# Patient Record
Sex: Female | Born: 1945 | Race: White | Hispanic: No | Marital: Single | Smoking: Current some day smoker
Health system: Southern US, Community
[De-identification: ages and names within clinical notes are randomized; demographics above are authoritative.]

## PROBLEM LIST (undated history)

## (undated) ENCOUNTER — Emergency Department (HOSPITAL_COMMUNITY): Admission: EM | Payer: Medicare Other | Source: Home / Self Care

## (undated) DIAGNOSIS — I2510 Atherosclerotic heart disease of native coronary artery without angina pectoris: Secondary | ICD-10-CM

## (undated) DIAGNOSIS — J449 Chronic obstructive pulmonary disease, unspecified: Secondary | ICD-10-CM

## (undated) DIAGNOSIS — I219 Acute myocardial infarction, unspecified: Secondary | ICD-10-CM

## (undated) DIAGNOSIS — I10 Essential (primary) hypertension: Secondary | ICD-10-CM

## (undated) HISTORY — PX: ABDOMINAL HYSTERECTOMY: SHX81

## (undated) HISTORY — PX: CORONARY ANGIOPLASTY WITH STENT PLACEMENT: SHX49

## (undated) HISTORY — PX: TONSILLECTOMY: SUR1361

---

## 2008-12-27 ENCOUNTER — Inpatient Hospital Stay: Payer: Self-pay | Admitting: Internal Medicine

## 2010-04-07 ENCOUNTER — Ambulatory Visit: Payer: Self-pay | Admitting: Family Medicine

## 2014-06-08 DIAGNOSIS — H269 Unspecified cataract: Secondary | ICD-10-CM | POA: Insufficient documentation

## 2015-11-27 ENCOUNTER — Other Ambulatory Visit: Payer: Self-pay | Admitting: Internal Medicine

## 2015-11-27 DIAGNOSIS — Z1231 Encounter for screening mammogram for malignant neoplasm of breast: Secondary | ICD-10-CM

## 2015-11-28 ENCOUNTER — Other Ambulatory Visit: Payer: Self-pay | Admitting: Internal Medicine

## 2015-11-28 ENCOUNTER — Ambulatory Visit
Admission: RE | Admit: 2015-11-28 | Discharge: 2015-11-28 | Disposition: A | Discharge status: Home / Self Care | Payer: Medicare Other | Source: Ambulatory Visit | Attending: Internal Medicine | Admitting: Internal Medicine

## 2015-11-28 DIAGNOSIS — Z1231 Encounter for screening mammogram for malignant neoplasm of breast: Secondary | ICD-10-CM

## 2015-12-02 ENCOUNTER — Other Ambulatory Visit: Payer: Self-pay | Admitting: Internal Medicine

## 2015-12-02 DIAGNOSIS — R928 Other abnormal and inconclusive findings on diagnostic imaging of breast: Secondary | ICD-10-CM

## 2015-12-09 ENCOUNTER — Ambulatory Visit
Admission: RE | Admit: 2015-12-09 | Discharge: 2015-12-09 | Disposition: A | Discharge status: Home / Self Care | Payer: Medicare Other | Source: Ambulatory Visit | Attending: Internal Medicine | Admitting: Internal Medicine

## 2015-12-09 ENCOUNTER — Other Ambulatory Visit: Payer: Self-pay | Admitting: Internal Medicine

## 2015-12-09 DIAGNOSIS — R928 Other abnormal and inconclusive findings on diagnostic imaging of breast: Secondary | ICD-10-CM

## 2015-12-09 DIAGNOSIS — R921 Mammographic calcification found on diagnostic imaging of breast: Secondary | ICD-10-CM | POA: Insufficient documentation

## 2015-12-19 DIAGNOSIS — R928 Other abnormal and inconclusive findings on diagnostic imaging of breast: Secondary | ICD-10-CM | POA: Insufficient documentation

## 2016-05-27 ENCOUNTER — Emergency Department (HOSPITAL_COMMUNITY): Payer: Medicare Other

## 2016-05-27 ENCOUNTER — Emergency Department (HOSPITAL_COMMUNITY)
Admission: EM | Admit: 2016-05-27 | Discharge: 2016-05-28 | Disposition: A | Discharge status: Other Acute Inpatient Hospital | Payer: Medicare Other | Attending: Emergency Medicine | Admitting: Emergency Medicine

## 2016-05-27 ENCOUNTER — Encounter (HOSPITAL_COMMUNITY): Payer: Self-pay

## 2016-05-27 DIAGNOSIS — R4182 Altered mental status, unspecified: Secondary | ICD-10-CM | POA: Diagnosis not present

## 2016-05-27 DIAGNOSIS — F22 Delusional disorders: Secondary | ICD-10-CM | POA: Insufficient documentation

## 2016-05-27 DIAGNOSIS — I10 Essential (primary) hypertension: Secondary | ICD-10-CM | POA: Insufficient documentation

## 2016-05-27 DIAGNOSIS — I2510 Atherosclerotic heart disease of native coronary artery without angina pectoris: Secondary | ICD-10-CM | POA: Insufficient documentation

## 2016-05-27 DIAGNOSIS — I252 Old myocardial infarction: Secondary | ICD-10-CM | POA: Insufficient documentation

## 2016-05-27 DIAGNOSIS — R072 Precordial pain: Secondary | ICD-10-CM | POA: Diagnosis not present

## 2016-05-27 DIAGNOSIS — F17210 Nicotine dependence, cigarettes, uncomplicated: Secondary | ICD-10-CM | POA: Insufficient documentation

## 2016-05-27 DIAGNOSIS — R109 Unspecified abdominal pain: Secondary | ICD-10-CM | POA: Diagnosis not present

## 2016-05-27 DIAGNOSIS — R079 Chest pain, unspecified: Secondary | ICD-10-CM

## 2016-05-27 HISTORY — DX: Acute myocardial infarction, unspecified: I21.9

## 2016-05-27 HISTORY — DX: Atherosclerotic heart disease of native coronary artery without angina pectoris: I25.10

## 2016-05-27 HISTORY — DX: Essential (primary) hypertension: I10

## 2016-05-27 LAB — COMPREHENSIVE METABOLIC PANEL
ALBUMIN: 4.8 g/dL (ref 3.5–5.0)
ALT: 45 U/L (ref 14–54)
ANION GAP: 8 (ref 5–15)
AST: 38 U/L (ref 15–41)
Alkaline Phosphatase: 97 U/L (ref 38–126)
BILIRUBIN TOTAL: 0.9 mg/dL (ref 0.3–1.2)
BUN: 13 mg/dL (ref 6–20)
CO2: 25 mmol/L (ref 22–32)
Calcium: 9.7 mg/dL (ref 8.9–10.3)
Chloride: 107 mmol/L (ref 101–111)
Creatinine, Ser: 0.88 mg/dL (ref 0.44–1.00)
GFR calc Af Amer: >60 mL/min (ref >60)
GFR calc non Af Amer: >60 mL/min (ref >60)
GLUCOSE: 106 mg/dL — AB (ref 65–99)
POTASSIUM: 3.5 mmol/L (ref 3.5–5.1)
SODIUM: 140 mmol/L (ref 135–145)
TOTAL PROTEIN: 8 g/dL (ref 6.5–8.1)

## 2016-05-27 LAB — URINALYSIS, ROUTINE W REFLEX MICROSCOPIC
Bilirubin Urine: NEGATIVE
Glucose, UA: NEGATIVE mg/dL
Hgb urine dipstick: NEGATIVE
Ketones, ur: NEGATIVE mg/dL
NITRITE: NEGATIVE
PH: 5.5 (ref 5.0–8.0)
Protein, ur: NEGATIVE mg/dL
SPECIFIC GRAVITY, URINE: 1.008 (ref 1.005–1.030)

## 2016-05-27 LAB — RAPID URINE DRUG SCREEN, HOSP PERFORMED
AMPHETAMINES: NOT DETECTED
Barbiturates: NOT DETECTED
Benzodiazepines: NOT DETECTED
COCAINE: NOT DETECTED
OPIATES: NOT DETECTED
TETRAHYDROCANNABINOL: NOT DETECTED

## 2016-05-27 LAB — CBC WITH DIFFERENTIAL/PLATELET
BASOS PCT: 0 %
Basophils Absolute: 0 10*3/uL (ref 0.0–0.1)
EOS ABS: 0 10*3/uL (ref 0.0–0.7)
Eosinophils Relative: 0 %
HEMATOCRIT: 42.7 % (ref 36.0–46.0)
Hemoglobin: 14.7 g/dL (ref 12.0–15.0)
Lymphocytes Relative: 19 %
Lymphs Abs: 1.5 10*3/uL (ref 0.7–4.0)
MCH: 29.9 pg (ref 26.0–34.0)
MCHC: 34.4 g/dL (ref 30.0–36.0)
MCV: 87 fL (ref 78.0–100.0)
MONO ABS: 0.6 10*3/uL (ref 0.1–1.0)
MONOS PCT: 7 %
Neutro Abs: 5.6 10*3/uL (ref 1.7–7.7)
Neutrophils Relative %: 74 %
Platelets: 172 10*3/uL (ref 150–400)
RBC: 4.91 MIL/uL (ref 3.87–5.11)
RDW: 13.3 % (ref 11.5–15.5)
WBC: 7.6 10*3/uL (ref 4.0–10.5)

## 2016-05-27 LAB — URINE MICROSCOPIC-ADD ON

## 2016-05-27 LAB — ETHANOL: Alcohol, Ethyl (B): <5 mg/dL (ref <5)

## 2016-05-27 LAB — TROPONIN I: Troponin I: <0.03 ng/mL (ref <0.031)

## 2016-05-27 MED ORDER — ACETAMINOPHEN 325 MG PO TABS: 650.0000 mg | ORAL_TABLET | ORAL | Status: DC | PRN

## 2016-05-27 MED ORDER — ONDANSETRON HCL 4 MG PO TABS: 4.0000 mg | ORAL_TABLET | Freq: Three times a day (TID) | ORAL | Status: DC | PRN

## 2016-05-27 MED ORDER — LORAZEPAM 2 MG/ML IJ SOLN
INTRAMUSCULAR | Status: AC
  Administered 2016-05-27: 2 mg via INTRAVENOUS
  Filled 2016-05-27: qty 1

## 2016-05-27 MED ORDER — ALUM & MAG HYDROXIDE-SIMETH 200-200-20 MG/5ML PO SUSP: 30.0000 mL | ORAL | Status: DC | PRN

## 2016-05-27 MED ORDER — IBUPROFEN 200 MG PO TABS: 600.0000 mg | ORAL_TABLET | Freq: Three times a day (TID) | ORAL | Status: DC | PRN

## 2016-05-27 MED ORDER — LORAZEPAM 2 MG/ML IJ SOLN
1.0000 mg | Freq: Once | INTRAMUSCULAR | Status: AC
  Administered 2016-05-27: 2 mg via INTRAVENOUS

## 2016-05-27 NOTE — ED Notes (Signed)
PT WANDERING IN THE HALLWAYS STATING, "I DON'T NEED TO BE HERE. MY INSURANCE HAS NOT AUTHORIZED [place]. I KNOW MY RIGHTS, AND I SHOULD BE ABLE TO LEAVE. THERE IS NOTHING WRONG WITH ME."
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
## 2016-05-27 NOTE — Progress Notes (Signed)
This writer completed a chart review for disposition.    Duc Crocket, MSW, LCSW, LCAS [facility] Triage Specialist [phone] 
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
## 2016-05-27 NOTE — ED Notes (Signed)
Bed: [id number] Expected date:  Expected time:  Means of arrival:  Comments: Room 20

## 2016-05-27 NOTE — ED Notes (Signed)
MD at bedside. 
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
## 2016-05-27 NOTE — ED Provider Notes (Signed)
CSN: [id number]     Arrival date & time 05/27/16  1318 History   First MD Initiated Contact with Patient 05/27/16 1354     Chief Complaint  Patient presents with  . IVC    . Delusional  . Abdominal Pain     (Consider location/radiation/quality/duration/timing/severity/associated sxs/prior Treatment) HPI Comments: Patient here with 3 day history of increasing paranoia according to the patient started. No suicidal or homicidal ideations. No prior history of same. Question history of UTI in the past. She also had 2 days ago had substernal chest pain that has been constant and reproducible without associated diaphoresis or dyspnea. No exertional component to it. No recent severe headaches. No syncope with it. Also has had some left-sided flank pain without fever or chills. No cough or congestion. No vomiting noted. Patient denies any new medications restarted. Because of the paranoia the daughter was concerned and took out an IVC on the patient.  Patient is a 70 y.o. female presenting with abdominal pain. The history is provided by the patient and a relative.  Abdominal Pain   Past Medical History  Diagnosis Date  . Hypertension   . MI (myocardial infarction) ([place])   . CAD (coronary artery disease)    Past Surgical History  Procedure Laterality Date  . Coronary angioplasty with stent placement    . Abdominal hysterectomy    . Tonsillectomy     History reviewed. No pertinent family history. Social History  Substance Use Topics  . Smoking status: Current Some Day Smoker    Types: Cigarettes  . Smokeless tobacco: None  . Alcohol Use: No   OB History    No data available     Review of Systems  Gastrointestinal: Positive for abdominal pain.  All other systems reviewed and are negative.     Allergies  Review of patient's allergies indicates not on file.  Home Medications   Prior to Admission medications   Not on File   BP 116/74 mmHg  Pulse 83  Temp(Src) 98.2 F (36.8  C) (Oral)  Resp 15  SpO2 99% Physical Exam  Constitutional: She is oriented to person, place, and time. She appears well-developed and well-nourished.  Non-toxic appearance. No distress.  HENT:  Head: Normocephalic and atraumatic.  Eyes: Conjunctivae, EOM and lids are normal. Pupils are equal, round, and reactive to light.  Neck: Normal range of motion. Neck supple. No tracheal deviation present. No thyroid mass present.  Cardiovascular: Normal rate, regular rhythm and normal heart sounds.  Exam reveals no gallop.   No murmur heard. Pulmonary/Chest: Effort normal and breath sounds normal. No stridor. No respiratory distress. She has no decreased breath sounds. She has no wheezes. She has no rhonchi. She has no rales.  Abdominal: Soft. Normal appearance and bowel sounds are normal. She exhibits no distension. There is no tenderness. There is no rebound and no CVA tenderness.  Musculoskeletal: Normal range of motion. She exhibits no edema or tenderness.  Neurological: She is alert and oriented to person, place, and time. She has normal strength. No cranial nerve deficit or sensory deficit. GCS eye subscore is 4. GCS verbal subscore is 5. GCS motor subscore is 6.  Skin: Skin is warm and dry. No abrasion and no rash noted.  Psychiatric: Her behavior is normal. Her affect is labile. Her speech is tangential. She expresses no suicidal plans and no homicidal plans.  Nursing note and vitals reviewed.   ED Course  Procedures (including critical care time) Labs Review Labs  Reviewed  URINE CULTURE  URINALYSIS, ROUTINE W REFLEX MICROSCOPIC (NOT AT [facility])  ETHANOL  URINE RAPID DRUG SCREEN, HOSP PERFORMED  CBC WITH DIFFERENTIAL/PLATELET  COMPREHENSIVE METABOLIC PANEL  TROPONIN I    Imaging Review No results found. I have personally reviewed and evaluated these images and lab results as part of my medical decision-making.   EKG Interpretation None      MDM   Final diagnoses:  Chest pain     Patient given Ativan due to severe agitation as well as placed in 4 point restraints. Has been evaluated by psychiatry who meets inpatient criteria    Lacretia Leigh, MD 05/27/16 1842

## 2016-05-27 NOTE — ED Notes (Signed)
PT VERY AGITATED AND TRYING TO LEAVE THE TREATMENT AREA. PT VERY AGGRESSIVE. SECURITY, CHARGE NURSE, NT X2 , AND DAUGHTER AT THE BEDSIDE. SOFT RESTRAINTS APPLIED FOR PT AND STAFF SAFETY.

## 2016-05-27 NOTE — ED Notes (Signed)
Raven Thompson has continued to be loud, pacing and alternating between her room and the unit.  She is requesting "something to help me sleep". She initially refused zyprexa and requested seroquel. After much encouragement she took zyprexa. Will continue to monitor for patient safety and medication effectiveness.
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
## 2016-05-27 NOTE — ED Notes (Signed)
Pt presents w/ [employer].  Per IVC paperwork, Pt has not been formally diagnosed w/ any mental health issue as far as family knows.  Pt was recently diagnosed w/ UTI and has become increasingly paranoid and delusional per family.  Pt believes that she is part of a prostitution ring and being stalked by the cartel.  Pt was complaining of abdominal pain and taken to ED.  Pt refused care d/t paranoia.

## 2016-05-27 NOTE — ED Notes (Signed)
Pt attempting to refuse ordered imaging.  Pt continually sts that she does not want anything done, until we get prior approval from her insurance company.  This Probation officer and other staff informed Pt that we do not need prior approval d/t imaging being considered emergent.  Pt, also, informed that she has been IVC'd and that testing is not optional.  Pt continues to refuse.  Agricultural consultant and EDP made aware.

## 2016-05-27 NOTE — ED Notes (Signed)
PT WANDED BY SECURITY. PT TRANSPORTED TO SAPU RM 42.

## 2016-05-27 NOTE — Plan of Care (Signed)
Per [facility] @2215 , this evening. Pt must be out of restraints for at least 24 hrs before placement can be considered

## 2016-05-28 DIAGNOSIS — F22 Delusional disorders: Secondary | ICD-10-CM | POA: Diagnosis present

## 2016-05-28 LAB — URINALYSIS, ROUTINE W REFLEX MICROSCOPIC
BILIRUBIN URINE: NEGATIVE
Glucose, UA: NEGATIVE mg/dL
HGB URINE DIPSTICK: NEGATIVE
KETONES UR: NEGATIVE mg/dL
Leukocytes, UA: NEGATIVE
Nitrite: NEGATIVE
PH: 7.5 (ref 5.0–8.0)
Protein, ur: NEGATIVE mg/dL
SPECIFIC GRAVITY, URINE: 1.006 (ref 1.005–1.030)

## 2016-05-28 LAB — RAPID HIV SCREEN (HIV 1/2 AB+AG)
HIV 1/2 Antibodies: NONREACTIVE
HIV-1 P24 ANTIGEN - HIV24: NONREACTIVE

## 2016-05-28 LAB — URINE CULTURE

## 2016-05-28 MED ORDER — HALOPERIDOL 1 MG PO TABS
0.5000 mg | ORAL_TABLET | Freq: Two times a day (BID) | ORAL | Status: DC
  Filled 2016-05-28: qty 1

## 2016-05-28 MED ORDER — HYDROXYZINE HCL 10 MG PO TABS
10.0000 mg | ORAL_TABLET | Freq: Two times a day (BID) | ORAL | Status: DC
  Filled 2016-05-28 (×3): qty 1

## 2016-05-28 MED ORDER — TRAZODONE HCL 50 MG PO TABS: 50.0000 mg | ORAL_TABLET | Freq: Every evening | ORAL | Status: DC | PRN
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
## 2016-05-28 NOTE — ED Notes (Signed)
Pt presents with paranoia. Refusing all prescribed medication.
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
## 2016-05-28 NOTE — ED Notes (Signed)
This nurse called phlebotomist [phone] for ordered lab draw.

## 2016-05-28 NOTE — ED Notes (Signed)
Sheriff at facility to transport pt to [facility] per MD order. Pt refusing discharge vs. Shirlee Limerick, RN at [facility] notified. Pt had no personal property. Pt ambulatory off unit with sheriff

## 2016-05-28 NOTE — ED Notes (Signed)
Pt daughter in room visiting with pt. Pt daughter wanted staff to know that pt had a tick bite about one week ago and that she was concerned about Lyme disease. This nurse forwarded this information to Donzetta Sprung, NP

## 2016-05-28 NOTE — Progress Notes (Signed)
Per Shirlee Limerick at [facility], pt has been accepted for admission by Dr. Geanie Kenning. Report can be called at [phone], pt can arrive anytime per Shirlee Limerick.

## 2016-05-28 NOTE — ED Notes (Signed)
This nurse called phlebotomist for blood draw for ordered lab.

## 2016-05-28 NOTE — BH Assessment (Signed)
Faxed copy of IVC paperwork to [facility].

## 2016-05-28 NOTE — Progress Notes (Addendum)
Shirlee Limerick at [facility] states that facility is reviewing pt's referral and "will be able to accept her pending receiving faxed copy of IVC papers."  Informed [facility] TTS.  Shirlee Limerick called back to also request EKG results.

## 2016-05-28 NOTE — ED Notes (Signed)
Pt irritable, delusional, bizarre reporting that she is not going to [place] that this is part of the black market with sex trafficking. Pt has no insight in regards to her behavior.

## 2016-05-29 DIAGNOSIS — W57XXXA Bitten or stung by nonvenomous insect and other nonvenomous arthropods, initial encounter: Secondary | ICD-10-CM | POA: Insufficient documentation

## 2016-05-29 DIAGNOSIS — R0981 Nasal congestion: Secondary | ICD-10-CM | POA: Insufficient documentation

## 2016-05-29 LAB — RPR: RPR Ser Ql: NONREACTIVE

## 2016-05-29 LAB — B. BURGDORFI ANTIBODIES: B burgdorferi Ab IgG+IgM: <0.91 {ISR} (ref 0.00–0.90)

## 2016-06-01 ENCOUNTER — Telehealth (HOSPITAL_BASED_OUTPATIENT_CLINIC_OR_DEPARTMENT_OTHER): Payer: Self-pay | Admitting: Emergency Medicine

## 2016-06-23 DIAGNOSIS — E538 Deficiency of other specified B group vitamins: Secondary | ICD-10-CM | POA: Insufficient documentation

## 2016-06-23 DIAGNOSIS — R9431 Abnormal electrocardiogram [ECG] [EKG]: Secondary | ICD-10-CM | POA: Insufficient documentation

## 2016-06-25 DIAGNOSIS — F09 Unspecified mental disorder due to known physiological condition: Secondary | ICD-10-CM | POA: Insufficient documentation

## 2016-07-23 ENCOUNTER — Ambulatory Visit (INDEPENDENT_AMBULATORY_CARE_PROVIDER_SITE_OTHER): Payer: 59 | Admitting: Psychiatry

## 2016-07-23 DIAGNOSIS — F22 Delusional disorders: Secondary | ICD-10-CM

## 2016-07-23 DIAGNOSIS — F0391 Unspecified dementia with behavioral disturbance: Secondary | ICD-10-CM

## 2016-07-23 DIAGNOSIS — F03918 Unspecified dementia, unspecified severity, with other behavioral disturbance: Secondary | ICD-10-CM

## 2016-07-23 MED ORDER — DIVALPROEX SODIUM 500 MG PO DR TAB: 500.0000 mg | DELAYED_RELEASE_TABLET | Freq: Every day | ORAL | 0 refills | Status: DC

## 2016-07-23 MED ORDER — RISPERIDONE 0.5 MG PO TABS: 0.5000 mg | ORAL_TABLET | Freq: Two times a day (BID) | ORAL | 0 refills | Status: DC
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
## 2016-08-05 ENCOUNTER — Encounter: Payer: Self-pay | Admitting: Psychiatry

## 2016-08-05 ENCOUNTER — Ambulatory Visit (INDEPENDENT_AMBULATORY_CARE_PROVIDER_SITE_OTHER): Payer: 59 | Admitting: Psychiatry

## 2016-08-05 ENCOUNTER — Ambulatory Visit (INDEPENDENT_AMBULATORY_CARE_PROVIDER_SITE_OTHER): Payer: 59 | Admitting: Licensed Clinical Social Worker

## 2016-08-05 VITALS — BP 144/78 | HR 73 | Temp 98.4°F | Ht 63.5 in | Wt 184.2 lb

## 2016-08-05 DIAGNOSIS — F0391 Unspecified dementia with behavioral disturbance: Secondary | ICD-10-CM

## 2016-08-05 DIAGNOSIS — I10 Essential (primary) hypertension: Secondary | ICD-10-CM | POA: Insufficient documentation

## 2016-08-05 DIAGNOSIS — F03918 Unspecified dementia, unspecified severity, with other behavioral disturbance: Secondary | ICD-10-CM

## 2016-08-05 DIAGNOSIS — F22 Delusional disorders: Secondary | ICD-10-CM

## 2016-08-05 DIAGNOSIS — I2510 Atherosclerotic heart disease of native coronary artery without angina pectoris: Secondary | ICD-10-CM | POA: Insufficient documentation

## 2016-08-05 NOTE — Progress Notes (Signed)
Psychiatric MD Progress Note  Patient Identification: Raven Thompson MRN:  [id number] Date of Evaluation:  08/05/2016 Referral Source: [place]  Chief Complaint:   Chief Complaint    Follow-up; Medication Refill     Visit Diagnosis:    ICD-9-CM ICD-10-CM   1. Delusional disorder ([place]) 297.1 F22   2. Dementia with behavioral disturbance 294.21 F03.91     History of Present Illness:    Patient is a 70 year old female with history of delusional disorder who was recently discharged from the [facility] presented for Follow-up. She continues to have tremors in her head although her medications were adjusted at the last appointment. She reported that she is having abdominal pain and was focused on the theme. She reported that she has started taking her medication and her daughter has helped her. She was focused on her abdominal pain and was discussing about that in length. She reported that her daughter has fixed her pill box and she takes Risperdal in the morning but it makes her feel weird. She feels very tired and it was noted that patient continues to have tremors in her head and her head was bobbing. Patient reported that she sleeps well with the help of Depakote and Seroquel. She stays by herself. Her daughter is keeping an eye on her medication. She denied having any paranoia at this time.  She is currently on outpatient commitment for medication management.    She currently denied having any suicidal homicidal ideations or plans. She is agreeable to her medication changes at this time.  Associated Signs/Symptoms: Depression Symptoms:  fatigue, feelings of worthlessness/guilt, difficulty concentrating, impaired memory, (Hypo) Manic Symptoms:  Delusions, Distractibility, Hallucinations, Impulsivity, Irritable Mood, Anxiety Symptoms:  Excessive Worry, Psychotic Symptoms:  Delusions, Paranoia, PTSD Symptoms: Negative NA  Past Psychiatric History:  Recent  admission to [facility] Patient was discharged on a combination of Depakote 500 twice a day and Risperdal 1 mg twice a day. She is currently on 180 days outpatient commitment  Previous Psychotropic Medications:  Risperdal 1 mg twice a day Depakote 500 mg twice a day Patient does not have any previous history of suicide attempts.  Substance Abuse History in the last 12 months:  No.  Consequences of Substance Abuse: Negative NA  Past Medical History:  Past Medical History:  Diagnosis Date  . CAD (coronary artery disease)   . Hypertension   . MI (myocardial infarction) [facility])     Past Surgical History:  Procedure Laterality Date  . ABDOMINAL HYSTERECTOMY    . CORONARY ANGIOPLASTY WITH STENT PLACEMENT    . TONSILLECTOMY      Family Psychiatric History:  Patient does not have any family history of psychiatric illness.  Family History: History reviewed. No pertinent family history.  Social History:   Social History   Social History  . Marital status: Single    Spouse name: N/A  . Number of children: N/A  . Years of education: N/A   Social History Main Topics  . Smoking status: Current Some Day Smoker    Types: Cigarettes  . Smokeless tobacco: Never Used  . Alcohol use No  . Drug use: No  . Sexual activity: Not Currently    Birth control/ protection: Surgical   Other Topics Concern  . None   Social History Narrative  . None    Additional Social History:  Lives in [place] by herself. She was married 3 times in the past. He has 3 daughters and they are supportive. Has  10 grandchildren.   Allergies:   Allergies  Allergen Reactions  . Sulfa Antibiotics Nausea Only    Metabolic Disorder Labs: No results found for: HGBA1C, MPG No results found for: PROLACTIN No results found for: CHOL, TRIG, HDL, CHOLHDL, VLDL, LDLCALC   Current Medications: Current Outpatient Prescriptions  Medication Sig Dispense Refill  . divalproex (DEPAKOTE) 500 MG DR  tablet Take 1 tablet (500 mg total) by mouth at bedtime. 30 tablet 0  . Phenylephrine-APAP-Guaifenesin (EQ SINUS CONGESTION & PAIN) 5-325-200 MG TABS Take 1 tablet by mouth 2 (two) times daily as needed (allergy).    . risperiDONE (RISPERDAL) 0.5 MG tablet Take 1 tablet (0.5 mg total) by mouth 2 (two) times daily. 60 tablet 0   No current facility-administered medications for this visit.     Neurologic: Headache: No Seizure: No Paresthesias:No  Musculoskeletal: Strength & Muscle Tone: within normal limits Gait & Station: normal Patient leans: N/A  Psychiatric Specialty Exam: Review of Systems  Constitutional: Positive for malaise/fatigue.  Gastrointestinal: Positive for abdominal pain and diarrhea.  Musculoskeletal: Positive for neck pain.  Neurological: Positive for headaches.    Blood pressure (!) 144/78, pulse 73, temperature 98.4 F (36.9 C), temperature source Oral, height 5' 3.5" (1.613 m), weight 184 lb 3.2 oz (83.6 kg).Body mass index is 32.12 kg/m.  General Appearance: Casual  Eye Contact:  Fair  Speech:  Clear and Coherent  Volume:  Decreased  Mood:  Anxious  Affect:  Congruent  Thought Process:  Disorganized  Orientation:  Full (Time, Place, and Person)  Thought Content:  WDL  Suicidal Thoughts:  No  Homicidal Thoughts:  No  Memory:  Immediate;   Poor  Judgement:  Fair  Insight:  Lacking  Psychomotor Activity:  Decreased  Concentration:  Concentration: Fair and Attention Span: Fair  Recall:  [employer] of Knowledge:Fair  Language: Fair  Akathisia:  No  Handed:  Right  AIMS (if indicated):    Assets:  Chief Executive Officer Social Support  ADL's:  Intact  Cognition: Impaired,  Mild  Sleep:  fair    Treatment Plan Summary: Medication management  Patient is currently showing side effects of her medications including adverse reactions from Depakote and bobbing head from the Risperdal. Continue  Depakote 500 mg by mouth daily at bedtime I will  decrease Risperdal 0.5 mg by mouth at bedtime and will stop the Risperdal in the morning. She will follow-up in 2 weeks or earlier.  Discussed with her about outpatient commitment in detail   More than 50% of the time spent in psychoeducation, counseling and coordination of care.    This note was generated in part or whole with voice recognition software. Voice regonition is usually quite accurate but there are transcription errors that can and very often do occur. I apologize for any typographical errors that were not detected and corrected.   Rainey Pines, MD 8/16/201712:13 PM
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
## 2016-08-12 ENCOUNTER — Encounter: Payer: Self-pay | Admitting: Radiology

## 2016-08-12 ENCOUNTER — Inpatient Hospital Stay: Payer: Medicare Other

## 2016-08-12 ENCOUNTER — Emergency Department: Payer: Medicare Other

## 2016-08-12 ENCOUNTER — Inpatient Hospital Stay
Admission: EM | Admit: 2016-08-12 | Discharge: 2016-08-15 | DRG: 175 | Disposition: A | Discharge status: Home / Self Care | Payer: Medicare Other | Attending: Internal Medicine | Admitting: Internal Medicine

## 2016-08-12 DIAGNOSIS — E876 Hypokalemia: Secondary | ICD-10-CM | POA: Diagnosis present

## 2016-08-12 DIAGNOSIS — Z79899 Other long term (current) drug therapy: Secondary | ICD-10-CM

## 2016-08-12 DIAGNOSIS — J9601 Acute respiratory failure with hypoxia: Secondary | ICD-10-CM | POA: Diagnosis present

## 2016-08-12 DIAGNOSIS — J440 Chronic obstructive pulmonary disease with acute lower respiratory infection: Secondary | ICD-10-CM | POA: Diagnosis present

## 2016-08-12 DIAGNOSIS — N179 Acute kidney failure, unspecified: Secondary | ICD-10-CM | POA: Diagnosis present

## 2016-08-12 DIAGNOSIS — F0390 Unspecified dementia without behavioral disturbance: Secondary | ICD-10-CM | POA: Diagnosis present

## 2016-08-12 DIAGNOSIS — R739 Hyperglycemia, unspecified: Secondary | ICD-10-CM | POA: Diagnosis present

## 2016-08-12 DIAGNOSIS — I509 Heart failure, unspecified: Secondary | ICD-10-CM | POA: Diagnosis present

## 2016-08-12 DIAGNOSIS — I2609 Other pulmonary embolism with acute cor pulmonale: Secondary | ICD-10-CM | POA: Diagnosis present

## 2016-08-12 DIAGNOSIS — I2510 Atherosclerotic heart disease of native coronary artery without angina pectoris: Secondary | ICD-10-CM | POA: Diagnosis present

## 2016-08-12 DIAGNOSIS — F17210 Nicotine dependence, cigarettes, uncomplicated: Secondary | ICD-10-CM | POA: Diagnosis present

## 2016-08-12 DIAGNOSIS — R0902 Hypoxemia: Secondary | ICD-10-CM

## 2016-08-12 DIAGNOSIS — I82431 Acute embolism and thrombosis of right popliteal vein: Secondary | ICD-10-CM | POA: Diagnosis present

## 2016-08-12 DIAGNOSIS — R579 Shock, unspecified: Secondary | ICD-10-CM | POA: Diagnosis present

## 2016-08-12 DIAGNOSIS — I252 Old myocardial infarction: Secondary | ICD-10-CM

## 2016-08-12 DIAGNOSIS — R109 Unspecified abdominal pain: Secondary | ICD-10-CM

## 2016-08-12 DIAGNOSIS — R778 Other specified abnormalities of plasma proteins: Secondary | ICD-10-CM | POA: Diagnosis present

## 2016-08-12 DIAGNOSIS — I2602 Saddle embolus of pulmonary artery with acute cor pulmonale: Secondary | ICD-10-CM | POA: Diagnosis not present

## 2016-08-12 DIAGNOSIS — I2699 Other pulmonary embolism without acute cor pulmonale: Secondary | ICD-10-CM | POA: Diagnosis present

## 2016-08-12 DIAGNOSIS — I110 Hypertensive heart disease with heart failure: Secondary | ICD-10-CM | POA: Diagnosis present

## 2016-08-12 DIAGNOSIS — J189 Pneumonia, unspecified organism: Secondary | ICD-10-CM | POA: Diagnosis present

## 2016-08-12 DIAGNOSIS — R590 Localized enlarged lymph nodes: Secondary | ICD-10-CM | POA: Diagnosis present

## 2016-08-12 DIAGNOSIS — Z955 Presence of coronary angioplasty implant and graft: Secondary | ICD-10-CM

## 2016-08-12 DIAGNOSIS — R0600 Dyspnea, unspecified: Secondary | ICD-10-CM

## 2016-08-12 HISTORY — DX: Chronic obstructive pulmonary disease, unspecified: J44.9

## 2016-08-12 LAB — GLUCOSE, CAPILLARY
GLUCOSE-CAPILLARY: 124 mg/dL — AB (ref 65–99)
GLUCOSE-CAPILLARY: 124 mg/dL — AB (ref 65–99)
Glucose-Capillary: 258 mg/dL — ABNORMAL HIGH (ref 65–99)
Glucose-Capillary: 135 mg/dL — ABNORMAL HIGH (ref 65–99)
Glucose-Capillary: 124 mg/dL — ABNORMAL HIGH (ref 65–99)

## 2016-08-12 LAB — CBC
HCT: 39.5 % (ref 35.0–47.0)
HCT: 32.8 % (ref 36.0–46.0)
HEMATOCRIT: 34.5 % — AB (ref 35.0–47.0)
HEMOGLOBIN: 12.8 g/dL (ref 12.0–16.0)
Hemoglobin: 11.5 g/dL — ABNORMAL LOW (ref 12.0–16.0)
Hemoglobin: 10.9 g/dL (ref 12.0–15.0)
MCH: 28.4 pg (ref 26.0–34.0)
MCH: 28.7 pg (ref 26.0–34.0)
MCH: 28.6 pg (ref 26.0–34.0)
MCHC: 32.3 g/dL (ref 32.0–36.0)
MCHC: 33.2 g/dL (ref 32.0–36.0)
MCHC: 33.4 g/dL (ref 30.0–36.0)
MCV: 87.9 fL (ref 80.0–100.0)
MCV: 86.6 fL (ref 80.0–100.0)
MCV: 85.8 fL (ref 78.0–100.0)
PLATELETS: 107 10*3/uL — AB (ref 150–440)
PLATELETS: 76 10*3/uL — AB (ref 150–440)
PLATELETS: 68 10*3/uL (ref 150–400)
RBC: 4.49 MIL/uL (ref 3.80–5.20)
RBC: 3.98 MIL/uL (ref 3.80–5.20)
RBC: 3.82 MIL/uL (ref 3.87–5.11)
RDW: 15 % — ABNORMAL HIGH (ref 11.5–14.5)
RDW: 15.1 % — AB (ref 11.5–14.5)
RDW: 14.9 % (ref 11.5–15.5)
WBC: 21 10*3/uL — ABNORMAL HIGH (ref 3.6–11.0)
WBC: 16.8 10*3/uL — ABNORMAL HIGH (ref 3.6–11.0)
WBC: 12.6 10*3/uL (ref 4.0–10.5)

## 2016-08-12 LAB — BLOOD GAS, ARTERIAL
ALLENS TEST (PASS/FAIL): POSITIVE — AB
Acid-base deficit: 8.2 mmol/L — ABNORMAL HIGH (ref 0.0–2.0)
Bicarbonate: 15.6 mEq/L — ABNORMAL LOW (ref 21.0–28.0)
FIO2: 1
O2 Saturation: 98.6 %
PCO2 ART: 27 mmHg — AB (ref 32.0–48.0)
PH ART: 7.37 (ref 7.350–7.450)
Patient temperature: 37
pO2, Arterial: 121 mmHg — ABNORMAL HIGH (ref 83.0–108.0)

## 2016-08-12 LAB — BASIC METABOLIC PANEL
ANION GAP: 6 (ref 5–15)
Anion gap: 14 (ref 5–15)
BUN: 19 mg/dL (ref 6–20)
BUN: 23 mg/dL — ABNORMAL HIGH (ref 6–20)
CALCIUM: 7.9 mg/dL — AB (ref 8.9–10.3)
CHLORIDE: 108 mmol/L (ref 101–111)
CO2: 18 mmol/L — AB (ref 22–32)
CO2: 20 mmol/L — AB (ref 22–32)
CREATININE: 1.4 mg/dL — AB (ref 0.44–1.00)
CREATININE: 0.84 mg/dL (ref 0.44–1.00)
Calcium: 8.8 mg/dL — ABNORMAL LOW (ref 8.9–10.3)
Chloride: 112 mmol/L — ABNORMAL HIGH (ref 101–111)
GFR calc non Af Amer: 37 mL/min — ABNORMAL LOW (ref >60)
GFR, EST AFRICAN AMERICAN: 43 mL/min — AB (ref >60)
Glucose, Bld: 254 mg/dL — ABNORMAL HIGH (ref 65–99)
Glucose, Bld: 119 mg/dL — ABNORMAL HIGH (ref 65–99)
Potassium: 4.4 mmol/L (ref 3.5–5.1)
Potassium: 3.8 mmol/L (ref 3.5–5.1)
SODIUM: 138 mmol/L (ref 135–145)
Sodium: 140 mmol/L (ref 135–145)

## 2016-08-12 LAB — TROPONIN I: Troponin I: 0.34 ng/mL (ref <0.03)

## 2016-08-12 LAB — MAGNESIUM: Magnesium: 2.1 mg/dL (ref 1.7–2.4)

## 2016-08-12 LAB — PROTIME-INR
INR: 1.31
INR: 2.27
Prothrombin Time: 16.4 seconds — ABNORMAL HIGH (ref 11.4–15.2)
Prothrombin Time: 25.4 seconds — ABNORMAL HIGH (ref 11.4–15.2)

## 2016-08-12 LAB — APTT
APTT: 81 s — AB (ref 24–36)
APTT: 58 s — AB (ref 24–36)
aPTT: 34 seconds (ref 24–36)

## 2016-08-12 LAB — MRSA PCR SCREENING: MRSA BY PCR: NEGATIVE

## 2016-08-12 MED ORDER — ALTEPLASE 100 MG IV SOLR
INTRAVENOUS | Status: AC
  Filled 2016-08-12: qty 100

## 2016-08-12 MED ORDER — IOPAMIDOL (ISOVUE-370) INJECTION 76%
100.0000 mL | Freq: Once | INTRAVENOUS | Status: AC | PRN
  Administered 2016-08-12: 100 mL via INTRAVENOUS

## 2016-08-12 MED ORDER — CETYLPYRIDINIUM CHLORIDE 0.05 % MT LIQD
7.0000 mL | Freq: Two times a day (BID) | OROMUCOSAL | Status: DC
  Administered 2016-08-12 – 2016-08-14 (×5): 7 mL via OROMUCOSAL

## 2016-08-12 MED ORDER — SODIUM CHLORIDE 0.9 % IV SOLN
Freq: Once | INTRAVENOUS | Status: AC
  Administered 2016-08-12: 10:00:00 via INTRAVENOUS

## 2016-08-12 MED ORDER — ONDANSETRON HCL 4 MG/2ML IJ SOLN
INTRAMUSCULAR | Status: AC
  Filled 2016-08-12: qty 2

## 2016-08-12 MED ORDER — DIVALPROEX SODIUM 500 MG PO DR TAB
500.0000 mg | DELAYED_RELEASE_TABLET | Freq: Every day | ORAL | Status: DC
  Administered 2016-08-13 – 2016-08-14 (×2): 500 mg via ORAL
  Filled 2016-08-12: qty 1
  Filled 2016-08-12 (×2): qty 2

## 2016-08-12 MED ORDER — MORPHINE SULFATE (PF) 4 MG/ML IV SOLN
4.0000 mg | Freq: Once | INTRAVENOUS | Status: AC
  Administered 2016-08-12: 4 mg via INTRAVENOUS

## 2016-08-12 MED ORDER — DIVALPROEX SODIUM ER 500 MG PO TB24: 500.0000 mg | ORAL_TABLET | Freq: Every day | ORAL | Status: DC

## 2016-08-12 MED ORDER — ONDANSETRON HCL 4 MG/2ML IJ SOLN
4.0000 mg | Freq: Once | INTRAMUSCULAR | Status: AC
Start: 2016-08-12 — End: 2016-08-12
  Administered 2016-08-12: 4 mg via INTRAVENOUS

## 2016-08-12 MED ORDER — SODIUM CHLORIDE 0.9 % IV SOLN
Freq: Once | INTRAVENOUS | Status: AC
  Administered 2016-08-12: 09:00:00 via INTRAVENOUS

## 2016-08-12 MED ORDER — SODIUM CHLORIDE 0.9 % IV SOLN: 250.0000 mL | INTRAVENOUS | Status: DC | PRN

## 2016-08-12 MED ORDER — MORPHINE SULFATE (PF) 4 MG/ML IV SOLN
INTRAVENOUS | Status: AC
  Filled 2016-08-12: qty 1

## 2016-08-12 MED ORDER — ASPIRIN 81 MG PO CHEW
CHEWABLE_TABLET | ORAL | Status: AC
  Filled 2016-08-12: qty 4

## 2016-08-12 MED ORDER — HEPARIN (PORCINE) IN NACL 100-0.45 UNIT/ML-% IJ SOLN
1700.0000 [IU]/h | INTRAMUSCULAR | Status: DC
  Administered 2016-08-12: 950 [IU]/h via INTRAVENOUS
  Administered 2016-08-13: 1450 [IU]/h via INTRAVENOUS
  Administered 2016-08-13: 1100 [IU]/h via INTRAVENOUS
  Filled 2016-08-12 (×5): qty 250

## 2016-08-12 MED ORDER — RISPERIDONE 0.25 MG PO TABS
0.2500 mg | ORAL_TABLET | Freq: Every day | ORAL | Status: DC
  Administered 2016-08-13 – 2016-08-14 (×2): 0.25 mg via ORAL
  Filled 2016-08-12 (×2): qty 1
  Filled 2016-08-12: qty 0.5
  Filled 2016-08-12: qty 1
  Filled 2016-08-12: qty 2

## 2016-08-12 MED ORDER — SODIUM CHLORIDE 0.9 % IV SOLN
250.0000 mL | Freq: Once | INTRAVENOUS | Status: AC
  Administered 2016-08-12: 250 mL via INTRAVENOUS

## 2016-08-12 MED ORDER — ACETAMINOPHEN 325 MG PO TABS
650.0000 mg | ORAL_TABLET | Freq: Four times a day (QID) | ORAL | Status: DC | PRN
  Administered 2016-08-12 – 2016-08-15 (×2): 650 mg via ORAL
  Filled 2016-08-12 (×2): qty 2

## 2016-08-12 MED ORDER — ASPIRIN 81 MG PO CHEW
324.0000 mg | CHEWABLE_TABLET | Freq: Once | ORAL | Status: AC
Start: 2016-08-12 — End: 2016-08-12
  Administered 2016-08-12: 324 mg via ORAL

## 2016-08-12 MED ORDER — INSULIN ASPART 100 UNIT/ML ~~LOC~~ SOLN
2.0000 [IU] | SUBCUTANEOUS | Status: DC
  Filled 2016-08-12: qty 2

## 2016-08-12 MED ORDER — ALTEPLASE (PULMONARY EMBOLISM) INFUSION
100.0000 mg | Freq: Once | INTRAVENOUS | Status: AC
  Administered 2016-08-12: 100 mg via INTRAVENOUS

## 2016-08-12 NOTE — ED Triage Notes (Addendum)
Pt arrives via [facility] from home with reports of increased shortness of breath and chest pain   Pt diaphoretic and pale upon arrival

## 2016-08-12 NOTE — Progress Notes (Signed)
Inpatient Diabetes Program Recommendations  AACE/ADA: New Consensus Statement on Inpatient Glycemic Control (2015)  Target Ranges:  Prepandial:   less than 140 mg/dL      Peak postprandial:   less than 180 mg/dL (1-2 hours)      Critically ill patients:  140 - 180 mg/dL   Results for MIO, PLACIDE (MRN [id number]) as of 08/12/2016 11:06  Ref. Range 08/12/2016 08:58  Glucose-Capillary Latest Ref Range: 65 - 99 mg/dL 258 (H)  Results for KERSTYN, FU (MRN [id number]) as of 08/12/2016 11:06  Ref. Range 08/12/2016 08:57  Glucose Latest Ref Range: 65 - 99 mg/dL 254 (H)   Review of Glycemic Control  Diabetes history:No Outpatient Diabetes medications: NA Current orders for Inpatient glycemic control: None  Inpatient Diabetes Program Recommendations:  Correction (SSI): No mention of history of diabetes noted in chart. Initial lab glucose 254 mg/dl at 8:57 and finger stick 258 mg/dl at 8:58. Noted hyperglycemia may be stress related. Please consider ordering CBGs with Novolog correction scale Q4H (if NPO or ACHS if diet ordered). HgbA1C: Please consider ordering an A1C to evaluate glycemic control over the past 2-3 months.  Thanks, Barnie Alderman, RN, MSN, CDE Diabetes Coordinator Inpatient Diabetes Program [phone] (Team Pager from [place] to [place]) [phone] ([facility] office) [phone] [facility] office) [phone] [facility] office)

## 2016-08-12 NOTE — ED Provider Notes (Signed)
[facility] Emergency Department Provider Note  ____________________________________________  Time seen: Approximately 10:11 AM  I have reviewed the triage vital signs and the nursing notes.   HISTORY  Chief Complaint Shortness of Breath and Chest Pain  Level 5 caveat:  Portions of the history and physical were unable to be obtained due to pain and respiratory status   HPI Raven Thompson is a 70 y.o. female with history of CAD status post MI and stent, hypertension, and delusional disorder who presents for evaluation of chest pain and shortness of breath. Patient reports 5 days of intermittent chest pain pleuritic in nature however much worse this morning. The pain is severe, bilateral chest, radiating to her upper back and abdomen, pleuritic in nature, associated with severe shortness of breath. She denies any personal or family history of PEs, history of malignancy, recent travel or immobilization, leg pain or swelling. She denies a history of COPD or emphysema. When EMS arrived at her house patient was tachycardic with systolics in the low [id number], satting in the 70s on RA. Patient was pale and diaphoretic. IVF were initiated.   Past Medical History:  Diagnosis Date  . CAD (coronary artery disease)   . Hypertension   . MI (myocardial infarction) [facility])     Patient Active Problem List   Diagnosis Date Noted  . CAD (coronary artery disease) 08/05/2016  . Essential hypertension 08/05/2016  . Mild cognitive disorder 06/25/2016  . Prolonged Q-T interval on ECG 06/23/2016  . Vitamin B12 deficiency 06/23/2016  . Congestion of nasal sinus 05/29/2016  . Tick bite 05/29/2016  . Delusional disorder ([place]) 05/28/2016  . Abnormal mammogram of right breast 12/19/2015  . Cataract 06/08/2014    Past Surgical History:  Procedure Laterality Date  . ABDOMINAL HYSTERECTOMY    . CORONARY ANGIOPLASTY WITH STENT PLACEMENT    . TONSILLECTOMY      Prior to Admission  medications   Medication Sig Start Date End Date Taking? Authorizing Provider  divalproex (DEPAKOTE) 500 MG DR tablet Take 1 tablet (500 mg total) by mouth at bedtime. 07/23/16   Rainey Pines, MD  risperiDONE (RISPERDAL) 0.5 MG tablet Take 1 tablet (0.5 mg total) by mouth 2 (two) times daily. 07/23/16   Rainey Pines, MD    Allergies Sulfa antibiotics  No family history on file.  Social History Social History  Substance Use Topics  . Smoking status: Current Some Day Smoker    Types: Cigarettes  . Smokeless tobacco: Never Used  . Alcohol use No    Review of Systems  Constitutional: Negative for fever. Eyes: Negative for visual changes. ENT: Negative for sore throat. Cardiovascular: + chest pain. Respiratory: + shortness of breath. Gastrointestinal: Negative for abdominal pain, vomiting or diarrhea. Genitourinary: Negative for dysuria. Musculoskeletal: Negative for back pain. Skin: Negative for rash. Neurological: Negative for headaches, weakness or numbness.  ____________________________________________   PHYSICAL EXAM:  VITAL SIGNS:  98.3 F (36.8 C) Oral 19 -- 90 % 15 L/min  124 -- -- 100/89    Constitutional: Alert and oriented, severe respiratory distress. HEENT:      Head: Normocephalic and atraumatic.         Eyes: Conjunctivae are normal. Sclera is non-icteric. EOMI. PERRL      Mouth/Throat: Mucous membranes are moist.       Neck: Supple with no signs of meningismus. Cardiovascular: Tachycardic with regular rhythm. No murmurs, gallops, or rubs. 2+ symmetrical distal pulses are present in all extremities. No JVD. Respiratory:  Tachypneic to the mid 30s, lung sounds clear with great air movement , no crackles, or wheezing. Gastrointestinal: Soft, distended, non tender, with positive bowel sounds. No rebound or guarding. NO pulsatile mass. Musculoskeletal: Nontender with normal range of motion in all extremities. No edema, cyanosis, or erythema of  extremities. Neurologic: Normal speech and language. Face is symmetric. Moving all extremities. No gross focal neurologic deficits are appreciated. Skin: Skin is warm, dry and intact. No rash noted. Psychiatric: Mood and affect are normal. Speech and behavior are normal.  ____________________________________________   LABS (all labs ordered are listed, but only abnormal results are displayed)  Labs Reviewed  GLUCOSE, CAPILLARY - Abnormal; Notable for the following:       Result Value   Glucose-Capillary 258 (*)    All other components within normal limits  BASIC METABOLIC PANEL - Abnormal; Notable for the following:    CO2 18 (*)    Glucose, Bld 254 (*)    Creatinine, Ser 1.40 (*)    Calcium 8.8 (*)    GFR calc non Af Amer 37 (*)    GFR calc Af Amer 43 (*)    All other components within normal limits  CBC - Abnormal; Notable for the following:    WBC 21.0 (*)    RDW 15.0 (*)    Platelets 107 (*)    All other components within normal limits  TROPONIN I - Abnormal; Notable for the following:    Troponin I 0.34 (*)    All other components within normal limits  BLOOD GAS, ARTERIAL - Abnormal; Notable for the following:    pCO2 arterial 27 (*)    pO2, Arterial 121 (*)    Bicarbonate 15.6 (*)    Acid-base deficit 8.2 (*)    Allens test (pass/fail) POSITIVE (*)    All other components within normal limits  APTT  PROTIME-INR  CBC   ____________________________________________  EKG  ED ECG REPORT I, Rudene Re, the attending physician, personally viewed and interpreted this ECG.  Sinus tachycardia, rate of 125, normal intervals, normal axis, no ST elevations or depressions, S1Q3T3 observed. ____________________________________________  RADIOLOGY  CTA c/a/p:  1. Acute bilateral pulmonary embolus with CT evidence of right heart strain (RV/LV ratio equals 1.7) consistent with at least sub massive (intermediate risk) pulmonary embolus. The presence of right  heart strain has been associated with an increased risk of morbidity and mortality. Consultation with Pulmonary Critical Care Medicine is recommended. 2. Right-sided pulmonary infarcts with potential associated infarct left lower lobe. 3. Thoracoabdominal aortic atherosclerosis without evidence for dissection or aneurysm. ____________________________________________   PROCEDURES  Procedure(s) performed: None Procedures Critical Care performed: yes  CRITICAL CARE Performed by: Rudene Re  ?  Total critical care time: 60 min  Critical care time was exclusive of separately billable procedures and treating other patients.  Critical care was necessary to treat or prevent imminent or life-threatening deterioration.  Critical care was time spent personally by me on the following activities: development of treatment plan with patient and/or surrogate as well as nursing, discussions with consultants, evaluation of patient's response to treatment, examination of patient, obtaining history from patient or surrogate, ordering and performing treatments and interventions, ordering and review of laboratory studies, ordering and review of radiographic studies, pulse oximetry and re-evaluation of patient's condition.  ____________________________________________   INITIAL IMPRESSION / ASSESSMENT AND PLAN / ED COURSE  70 y.o. female with history of CAD status post MI and stent, hypertension, and delusional disorder who presents for evaluation  of chest pain and shortness of breath. Found to be in severe respiratory distress, tachypnea to the mid 30s, hypoxic requiring 15 L nonrebreather presets in the mid 90s, patient was tachycardic to the 120s and blood pressure in the low 100s. Patient had clear lungs on arrival and a strong pulses in all 4 extremities. Bedside ultrasound was done to eval for AAA vs dissection while IVs were established. Patient started to receive IVF resuscitation. Patient  was taken straight to the CT scan where she was found to have multiple bilateral submassive PEs. She received 2L of NS with no changes in her VS. CT was also showing bilateral pulmonary infarcts. Long discussion between myself, patient, and her 2 daughters about risks and benefits of thrombolytics. Patient and daughters agreed on receiving thrombolytics. I also spoke with Dr. Barbie Banner, [place] radiology at [facility] to see if patient would be a candidate for catheter lysis however due to her hemodynamically instability patient was deemed unsafe for transfer and IV thrombolysis was initiated. I also spoke with Dr. Lucky Cowboy, vascular surgery, who agreed with IV thrombolytics and since he was in the OR he was going to see patient in the ICU. She received IV alteplase with improvement of her vital signs, decreased oxygen requirement,  and improvement of pain in the emergency department. Patient was then admitted to pulmonary critical care for further management.   Clinical Course    Pertinent labs & imaging results that were available during my care of the patient were reviewed by me and considered in my medical decision making (see chart for details).    ____________________________________________   FINAL CLINICAL IMPRESSION(S) / ED DIAGNOSES  Final diagnoses:  Other acute pulmonary embolism with acute cor pulmonale (HCC)  Shock ([place])      NEW MEDICATIONS STARTED DURING THIS VISIT:  New Prescriptions   No medications on file     Note:  This document was prepared using Dragon voice recognition software and may include unintentional dictation errors.    Rudene Re, MD 08/12/16 1149

## 2016-08-12 NOTE — Progress Notes (Signed)
Patient has new bruising to upper mid back with swelling not present on arrival to floor. Per patient's daughters patient had fall at home before coming to ER. Dr. Ashby Dawes notified. No new orders, will continue to assess. Wilnette Kales

## 2016-08-12 NOTE — H&P (Addendum)
[facility] H&P    ASSESSMENT/PLAN   Patient is a 70 year old female with history of paranoia, dementia, delusions,  a month long involuntary psych hospitalization, now presents with acute chest pain and CT chest consistent with acute pulmonary embolism.  PULMONARY A:Acute bilateral pulmonary emboli with acute hypoxic respiratory failure. P:   -Patient is receiving TPA. -Once completed, the patient will be started on IV heparin. If the patient is doing well tomorrow, can be transitioned to oral anticoagulation.  CARDIOVASCULAR A: Right heart failure, elevated troponin. -EKG changes consistent with right heart strain. -History of coronary artery disease with previous MI. P:  We'll continue to monitor on telemetry. Will consider cardiac consultation before the patient's discharge, she has not seen a cardiologist in some time.  RENAL A:  Mild acute kidney injury, likely prerenal. P:   -Continue IV fluid resuscitation.  GASTROINTESTINAL -GI prophylaxis.  HEMATOLOGIC A:  Submassive PE, likely secondary to recent hospitalization with decreased ambulation. P:  -We'll check  lower extremity Dopplers.  INFECTIOUS --  Micro/culture results:  BCx2 -- UC -- Sputum--  Antibiotics:   ENDOCRINE A:  Hyperglycemia.   P:   -Leading scale insulin.  NEUROLOGIC/PSYCH A:  History of paranoia, delusions, dementia, recent involuntary commitment to psychiatric facility P:   We'll continue the patient's Depakote and Seroquel as tolerated.    MAJOR EVENTS/TEST RESULTS:   Best Practices  DVT Prophylaxis: On IV heparin. GI Prophylaxis: Protonix.   ---------------------------------------  ---------------------------------------   Name: Raven Thompson MRN: [id number] DOB: 01/02/1946    ADMISSION DATE:  08/12/2016  CHIEF COMPLAINT:  Dyspnea   HISTORY OF PRESENT ILLNESS:   The patient is a 70 year old female, she is currently very short of  that, therefore, all history is obtained from the chart, from staff, and from the patient's 2 daughters are at the bedside. Case was also discussed with ED staff. The patient's daughters tell me that she is been short of breath. Complaining of central chest pain for the last 3-4 days. Pain was first noted 4 days ago, seemed to abate on its own, and then came back this morning. The patient is lives on her own, the daughter received a call from the patient, saying that she was very short of breath and needed to be taken to the hospital.  Daughter tells me that she initially refused the ambulance, but then conceded. Subsequently, upon presentation to the emergency room, the patient was found to be very short of breath, she was noted to have platypnea, was put on 100% nonrebreather. CT of the chest showed bilateral pulmonary emboli with right-sided infarcts. I discussed the patient with ED physician who also discussed case with vascular surgery. The patient was started on TPA, approximate one hour after starting the TPA. The patient's breathing improved, her oxygen was decreased from 15+ liters nonrebreather. 2. Prostatectomy 10 L, her heart rate decreased from 110-95, her respiratory rate decreased from 30-25.  My review of her CT of the chest images, there is bilateral pulmonary emboli, results of the right side with evidence of right heart strain. There is mild subcarinal lymphadenopathy with extension to the right infrahilar area. This appears to be suspicious for cancer versus reactive. Review of EKG shows Q waves in lead 3, inverted T waves, not seen on previous EKG which may be consistent with acute pulmonary emboli.  Approximate one month ago. She was discharged from a one-month stay in an inpatient psychiatric facility for delusions, she continues to be  under her 180 day psych care.  The patient has a history of paranoia and delusions, she is followed by psychiatry. She often refuses medical therapy,  she avoids seeing the doctor or going for any sort of medical care, she has not been taking prescribed medications outpatient regularly. She is a history of coronary artery disease, she presented to [facility] with chest pain, a CODE BLUE was called. The patient had a stent placed. Since that time she had not taken her prescribed medications for this.   PAST MEDICAL HISTORY :  Past Medical History:  Diagnosis Date  . CAD (coronary artery disease)   . COPD (chronic obstructive pulmonary disease) ([place])   . Hypertension   . MI (myocardial infarction) [facility])    Past Surgical History:  Procedure Laterality Date  . ABDOMINAL HYSTERECTOMY    . CORONARY ANGIOPLASTY WITH STENT PLACEMENT    . TONSILLECTOMY     Prior to Admission medications   Medication Sig Start Date End Date Taking? Authorizing Provider  divalproex (DEPAKOTE) 500 MG DR tablet Take 1 tablet (500 mg total) by mouth at bedtime. 07/23/16  Yes Rainey Pines, MD  risperiDONE (RISPERDAL) 0.5 MG tablet Take 1 tablet (0.5 mg total) by mouth 2 (two) times daily. 07/23/16  Yes Rainey Pines, MD   Allergies  Allergen Reactions  . Sulfa Antibiotics Nausea Only    FAMILY HISTORY:  No family history on file. SOCIAL HISTORY:  reports that she has been smoking Cigarettes.  She has never used smokeless tobacco. She reports that she does not drink alcohol or use drugs.  REVIEW OF SYSTEMS:   Could not provide ROS secondary to dyspnea.   VITAL SIGNS: Temp:  [98.3 F (36.8 C)] 98.3 F (36.8 C) (08/23 0908) Pulse Rate:  [124] 124 (08/23 0908) Resp:  [19] 19 (08/23 0908) BP: (100)/(89) 100/89 (08/23 0908) SpO2:  [90 %-99 %] 90 % (08/23 0908) FiO2 (%):  [100 %] 100 % (08/23 0850) Weight:  [180 lb (81.6 kg)] 180 lb (81.6 kg) (08/23 0910) HEMODYNAMICS:   VENTILATOR SETTINGS: FiO2 (%):  [100 %] 100 % INTAKE / OUTPUT: No intake or output data in the 24 hours ending 08/12/16 1131  Physical Examination:   VS: BP 100/89 (BP Location: Right Arm)    Pulse (!) 124   Temp 98.3 F (36.8 C) (Oral)   Resp 19   Ht 5\' 4"  (1.626 m)   Wt 180 lb (81.6 kg)   SpO2 90%   BMI 30.90 kg/m   General Appearance: Tachypneic, anxious Neuro:without focal findings, mental status, speech normal,. HEENT: PERRLA, EOM intact, no ptosis, no other lesions noticed;  Pulmonary: normal breath sounds., Reduced bilaterally. CardiovascularNormal S1,S2.  No m/r/g.    Abdomen: Benign, Soft, non-tender, No masses, hepatosplenomegaly, No lymphadenopathy Renal:  No costovertebral tenderness  GU:  Not performed at this time. Endoc: No evident thyromegaly, no signs of acromegaly. Skin:   warm, no rashes, no ecchymosis  Extremities: normal, no cyanosis, clubbing, no edema, warm with normal capillary refill.    LABS: Reviewed   LABORATORY PANEL:   CBC  Recent Labs Lab 08/12/16 0857  WBC 21.0*  HGB 12.8  HCT 39.5  PLT 107*    Chemistries   Recent Labs Lab 08/12/16 0857  NA 140  K 4.4  CL 108  CO2 18*  GLUCOSE 254*  BUN 19  CREATININE 1.40*  CALCIUM 8.8*     Recent Labs Lab 08/12/16 0858  GLUCAP 258*    Recent Labs Lab  08/12/16 0910  PHART 7.37  PCO2ART 27*  PO2ART 121*   No results for input(s): AST, ALT, ALKPHOS, BILITOT, ALBUMIN in the last 168 hours.  Cardiac Enzymes  Recent Labs Lab 08/12/16 0857  TROPONINI 0.34*    RADIOLOGY:  Ct Angio Chest/abd/pel For Dissection W And/or Wo Contrast  Result Date: 08/12/2016 CLINICAL DATA:  Shortness of breath and chest pain beginning this morning. EXAM: CT ANGIOGRAPHY CHEST, ABDOMEN AND PELVIS TECHNIQUE: Multidetector CT imaging through the chest, abdomen and pelvis was performed using the standard protocol during bolus administration of intravenous contrast. Multiplanar reconstructed images and MIPs were obtained and reviewed to evaluate the vascular anatomy. CONTRAST:  100 cc Isovue 370 COMPARISON:  Abdomen and pelvis CT from 12/27/2008. FINDINGS: CTA CHEST FINDINGS Cardiovascular:  Heart size is upper normal. No evidence for hyperdense crescent in the wall of the thoracic aorta to suggest acute intramural hematoma. No evidence for thoracic aortic dissection flap. Patient is noted to have a large volume pulmonary embolus visible in the right main pulmonary artery extending into lobar pulmonary arteries and all 3 lobes of the right lung. Lobar pulmonary embolus is identified in the upper and lower lobes of the left lung as well. The RV LV ratio is calculated at 1.7. Mediastinum/Nodes: 9 mm short axis subcarinal lymph node is upper normal. 9 mm short axis right hilar lymph node is upper normal. No overt mediastinal lymphadenopathy. No evidence for hilar lymphadenopathy. The esophagus has normal imaging features. There is no axillary lymphadenopathy. Lungs/Pleura: Wedge-shaped peripheral focus of airspace disease in the right middle lobe is assisted with fairly prominent area of alveolar ground-glass attenuation posteriorly in the right lower lobe. There is some left base collapse/ consolidation. Small bilateral pleural effusions noted left greater than right. Musculoskeletal: Bone windows reveal no worrisome lytic or sclerotic osseous lesions. Review of the MIP images confirms the above findings. CTA ABDOMEN AND PELVIS FINDINGS Hepatobiliary: No focal abnormality within the liver parenchyma. There is no evidence for gallstones, gallbladder wall thickening, or pericholecystic fluid. No intrahepatic or extrahepatic biliary dilation. Pancreas: No focal mass lesion. No dilatation of the main duct. No intraparenchymal cyst. No peripancreatic edema. Spleen: No splenomegaly. No focal mass lesion. Adrenals/Urinary Tract: No adrenal nodule or mass. No enhancing abnormality identified in either kidney. No hydronephrosis. No evidence for hydroureter. Bladder is decompressed. Stomach/Bowel: Stomach is nondistended. No gastric wall thickening. No evidence of outlet obstruction. Duodenum is normally positioned  as is the ligament of Treitz. No small bowel wall thickening. No small bowel dilatation. The terminal ileum is normal. The appendix is normal. Diverticular changes are noted in the left colon without evidence of diverticulitis. Vascular/Lymphatic: There is abdominal aortic atherosclerosis without aneurysm. No dissection of the abdominal aorta. Calcific plaque identified at the origin of the celiac axis and SMA although no evidence for flow limiting stenosis. The IMA opacifies normally. Calcific plaque evident at the origin of each single renal artery without evidence for renal artery stenosis. There is no gastrohepatic or hepatoduodenal ligament lymphadenopathy. No intraperitoneal or retroperitoneal lymphadenopathy. No pelvic sidewall lymphadenopathy. Reproductive: Uterus surgically absent.  There is no adnexal mass. Other: No intraperitoneal free fluid. Musculoskeletal: Bone windows reveal no worrisome lytic or sclerotic osseous lesions. Review of the MIP images confirms the above findings. IMPRESSION: 1. Acute bilateral pulmonary embolus with CT evidence of right heart strain (RV/LV ratio equals 1.7) consistent with at least sub massive (intermediate risk) pulmonary embolus. The presence of right heart strain has been associated with an increased risk  of morbidity and mortality. Consultation with Pulmonary Critical Care Medicine is recommended. 2. Right-sided pulmonary infarcts with potential associated infarct left lower lobe. 3. Thoracoabdominal aortic atherosclerosis without evidence for dissection or aneurysm. I personally discussed the results by telephone with Dr. Alfred Levins at approximately 0945 hours on 08/12/2016. Electronically Signed   By: Misty Stanley M.D.   On: 08/12/2016 10:07       --Marda Stalker, MD.  Board Certified in Internal Medicine, Pulmonary Medicine, [place], and Sleep Medicine.  ICU Pager [phone] [facility] Office Number:  [phone]  Patricia Pesa, M.D.  Vilinda Boehringer, M.D.  Merton Border, M.D   08/12/2016, 11:31 AM   [place].  I have personally obtained a history, examined the patient, evaluated laboratory and imaging results, formulated the assessment and plan and placed orders. The Patient requires high complexity decision making for assessment and support, frequent evaluation and titration of therapies, application of advanced monitoring technologies and extensive interpretation of multiple databases. The patient has critical illness that could lead imminently to failure of 1 or more organ systems and requires the highest level of physician preparedness to intervene.  Critical Care Time devoted to patient care services described in this note is 45 minutes and is exclusive of time spent in procedures.

## 2016-08-12 NOTE — Therapy (Signed)
Patient received via emergency traffic. C/O chest/back pain and SOB. Currently on NRB with SpO2 99%. Breath sounds clear throughout, patient tachypnic, RR 40, HR 126. ABG done, PaO2 121 on FiO2 1.00. Patient alert but somewhat confused, very agitated due to pain and SOB.

## 2016-08-12 NOTE — Progress Notes (Signed)
ANTICOAGULATION CONSULT NOTE - Initial Consult  Pharmacy Consult for Heparin Indication: pulmonary embolus  Allergies  Allergen Reactions  . Sulfa Antibiotics Nausea Only    Patient Measurements: Height: 5\' 3"  (160 cm) Weight: 183 lb 13.8 oz (83.4 kg) IBW/kg (Calculated) : 52.4 Heparin Dosing Weight: 70.9 kg  Vital Signs: Temp: 98.4 F (36.9 C) (08/23 1240) Temp Source: Oral (08/23 1240) BP: 123/76 (08/23 1500) Pulse Rate: 81 (08/23 1500)  Labs:  Recent Labs  08/12/16 0857 08/12/16 1323  HGB 12.8 11.5*  HCT 39.5 34.5*  PLT 107* 76*  APTT 34 81*  LABPROT 16.4* 25.4*  INR 1.31 2.27  CREATININE 1.40*  --   TROPONINI 0.34*  --     Estimated Creatinine Clearance: 38.3 mL/min (by C-G formula based on SCr of 1.4 mg/dL).   Medical History: Past Medical History:  Diagnosis Date  . CAD (coronary artery disease)   . COPD (chronic obstructive pulmonary disease) ([place])   . Hypertension   . MI (myocardial infarction) ([place])     Medications:  Scheduled:  . alteplase      . antiseptic oral rinse  7 mL Mouth Rinse BID  . aspirin      . divalproex  500 mg Oral QHS  . insulin aspart  2-6 Units Subcutaneous Q4H  . morphine      . ondansetron      . risperiDONE  0.25 mg Oral QHS   Infusions:    Assessment: 70 y/o F admitted with PE now s/p TPA to begin heparin infusion once aPTT < 80.   Goal of Therapy:  Heparin level 0.3-0.5 x 24 hours then Heparin level 0.3-0.7 units/ml Monitor platelets by anticoagulation protocol: Yes   Plan:  APTT= 81. Will recheck aPTT in 2 hours and begin heparin at 14 units/kg/hr without bolus once aPTT < 80.    Ulice Dash D 08/12/2016,4:29 PM

## 2016-08-12 NOTE — Progress Notes (Addendum)
ANTICOAGULATION CONSULT NOTE - Initial Consult  Pharmacy Consult for Heparin Indication: pulmonary embolus  Allergies  Allergen Reactions  . Sulfa Antibiotics Nausea Only    Patient Measurements: Height: 5\' 3"  (160 cm) Weight: 183 lb 13.8 oz (83.4 kg) IBW/kg (Calculated) : 52.4 Heparin Dosing Weight: 70.9 kg  Vital Signs: Temp: 98.4 F (36.9 C) (08/23 1240) Temp Source: Oral (08/23 1240) BP: 108/75 (08/23 1600) Pulse Rate: 80 (08/23 1600)  Labs:  Recent Labs  08/12/16 0857 08/12/16 1323 08/12/16 1641  HGB 12.8 11.5*  --   HCT 39.5 34.5*  --   PLT 107* 76*  --   APTT 34 81* 58*  LABPROT 16.4* 25.4*  --   INR 1.31 2.27  --   CREATININE 1.40*  --   --   TROPONINI 0.34*  --   --     Estimated Creatinine Clearance: 38.3 mL/min (by C-G formula based on SCr of 1.4 mg/dL).   Medical History: Past Medical History:  Diagnosis Date  . CAD (coronary artery disease)   . COPD (chronic obstructive pulmonary disease) ([place])   . Hypertension   . MI (myocardial infarction) ([place])     Medications:  Scheduled:  . alteplase      . antiseptic oral rinse  7 mL Mouth Rinse BID  . aspirin      . divalproex  500 mg Oral QHS  . insulin aspart  2-6 Units Subcutaneous Q4H  . morphine      . ondansetron      . risperiDONE  0.25 mg Oral QHS   Infusions:    Assessment: 70 y/o F admitted with PE now s/p TPA to begin heparin infusion once aPTT < 80.   Goal of Therapy:  Heparin level 0.3-0.5 x 24 hours then Heparin level 0.3-0.7 units/ml Monitor platelets by anticoagulation protocol: Yes   Plan:  8/23 @ 1323 APTT= 81. Will recheck aPTT in 2 hours and begin heparin at 14 units/kg/hr without bolus once aPTT < 80.   8/23 @ 1641 APTT= 58. Will begin heparin at 950 units/hr (14units/kg/hr). Will check APTT again in 6 hours. Goal is to maintain heparin level 0.3-0.5 for 1st  24 hours.  8/24 00:00 heparin level 0.21. Increase rate to 1100 units/hour and recheck in 6 hours.  Nancy Fetter, PharmD Clinical Pharmacist 08/12/2016 5:35 PM

## 2016-08-12 NOTE — ED Notes (Signed)
Report called to Brandy RN

## 2016-08-13 ENCOUNTER — Inpatient Hospital Stay: Payer: Medicare Other

## 2016-08-13 LAB — BASIC METABOLIC PANEL
ANION GAP: 6 (ref 5–15)
BUN: 25 mg/dL — AB (ref 6–20)
CO2: 21 mmol/L — ABNORMAL LOW (ref 22–32)
Calcium: 8.1 mg/dL — ABNORMAL LOW (ref 8.9–10.3)
Chloride: 112 mmol/L — ABNORMAL HIGH (ref 101–111)
Creatinine, Ser: 0.86 mg/dL (ref 0.44–1.00)
Glucose, Bld: 90 mg/dL (ref 65–99)
POTASSIUM: 3.8 mmol/L (ref 3.5–5.1)
SODIUM: 139 mmol/L (ref 135–145)

## 2016-08-13 LAB — BLOOD GAS, ARTERIAL
Acid-base deficit: 1.8 mmol/L (ref 0.0–2.0)
Allens test (pass/fail): POSITIVE — AB
Bicarbonate: 21.7 mEq/L (ref 21.0–28.0)
DELIVERY SYSTEMS: POSITIVE
EXPIRATORY PAP: 6
FIO2: 28
INSPIRATORY PAP: 12
O2 Saturation: 96.6 %
PCO2 ART: 32 mmHg (ref 32.0–48.0)
PO2 ART: 83 mmHg (ref 83.0–108.0)
Patient temperature: 37
pH, Arterial: 7.44 (ref 7.350–7.450)

## 2016-08-13 LAB — GLUCOSE, CAPILLARY
GLUCOSE-CAPILLARY: 79 mg/dL (ref 65–99)
Glucose-Capillary: 78 mg/dL (ref 65–99)
Glucose-Capillary: 80 mg/dL (ref 65–99)
Glucose-Capillary: 87 mg/dL (ref 65–99)
Glucose-Capillary: 88 mg/dL (ref 65–99)

## 2016-08-13 LAB — HEPARIN LEVEL (UNFRACTIONATED)
HEPARIN UNFRACTIONATED: 0.21 [IU]/mL — AB (ref 0.30–0.70)
HEPARIN UNFRACTIONATED: 0.1 [IU]/mL — AB (ref 0.30–0.70)
Heparin Unfractionated: 0.21 IU/mL — ABNORMAL LOW (ref 0.30–0.70)

## 2016-08-13 LAB — CBC
HEMATOCRIT: 35 % (ref 35.0–47.0)
HEMOGLOBIN: 11.4 g/dL — AB (ref 12.0–16.0)
MCH: 28.6 pg (ref 26.0–34.0)
MCHC: 32.7 g/dL (ref 32.0–36.0)
MCV: 87.5 fL (ref 80.0–100.0)
PLATELETS: 85 10*3/uL — AB (ref 150–440)
RBC: 4 MIL/uL (ref 3.80–5.20)
RDW: 15 % — ABNORMAL HIGH (ref 11.5–14.5)
WBC: 11.2 10*3/uL — ABNORMAL HIGH (ref 3.6–11.0)

## 2016-08-13 LAB — PROTIME-INR
INR: 1.59
Prothrombin Time: 19.1 seconds — ABNORMAL HIGH (ref 11.4–15.2)

## 2016-08-13 LAB — APTT: aPTT: 96 seconds — ABNORMAL HIGH (ref 24–36)

## 2016-08-13 LAB — PHOSPHORUS: PHOSPHORUS: 3.5 mg/dL (ref 2.5–4.6)

## 2016-08-13 LAB — MAGNESIUM: MAGNESIUM: 2.3 mg/dL (ref 1.7–2.4)

## 2016-08-13 MED ORDER — DEXTROSE 5 % IV SOLN
1.0000 g | Freq: Two times a day (BID) | INTRAVENOUS | Status: DC
  Administered 2016-08-13: 1 g via INTRAVENOUS
  Filled 2016-08-13 (×2): qty 10

## 2016-08-13 MED ORDER — DEXTROSE 5 % IV SOLN
1.0000 g | INTRAVENOUS | Status: DC
  Administered 2016-08-14: 1 g via INTRAVENOUS
  Filled 2016-08-13 (×2): qty 10

## 2016-08-13 MED ORDER — MORPHINE SULFATE (PF) 2 MG/ML IV SOLN
INTRAVENOUS | Status: AC
  Administered 2016-08-13: 2 mg via INTRAVENOUS
  Filled 2016-08-13: qty 1

## 2016-08-13 MED ORDER — MORPHINE SULFATE (PF) 2 MG/ML IV SOLN
2.0000 mg | INTRAVENOUS | Status: DC | PRN
  Administered 2016-08-13 (×3): 2 mg via INTRAVENOUS
  Administered 2016-08-13 – 2016-08-14 (×2): 4 mg via INTRAVENOUS
  Filled 2016-08-13: qty 1
  Filled 2016-08-13 (×2): qty 2
  Filled 2016-08-13: qty 1
  Filled 2016-08-13: qty 2

## 2016-08-13 MED ORDER — IPRATROPIUM-ALBUTEROL 0.5-2.5 (3) MG/3ML IN SOLN
RESPIRATORY_TRACT | Status: AC
  Administered 2016-08-13: 3 mL via RESPIRATORY_TRACT
  Filled 2016-08-13: qty 3

## 2016-08-13 MED ORDER — IPRATROPIUM-ALBUTEROL 0.5-2.5 (3) MG/3ML IN SOLN
3.0000 mL | Freq: Four times a day (QID) | RESPIRATORY_TRACT | Status: DC
  Administered 2016-08-13 – 2016-08-15 (×6): 3 mL via RESPIRATORY_TRACT
  Filled 2016-08-13 (×7): qty 3

## 2016-08-13 NOTE — Progress Notes (Signed)
1700 Tolerating BiPAP well. Resp. Rate in low 20s.

## 2016-08-13 NOTE — Progress Notes (Signed)
1430 Noticeable increase in respiratory.effort and rate. More rhonchi noted.bilaterally. Dr. Alva Garnet paged. Orders received for DUO nebs. RT paged. Also complained of pain in lower ribcage.

## 2016-08-13 NOTE — Progress Notes (Signed)
1720 Trying  to get out of bed.Ranting hysterically that we are trying to kill her and she cant breathe. Crying to remove BiPAP. Given prn Morphine. ABG drawn. Holding patients hand.

## 2016-08-13 NOTE — Care Management Note (Signed)
Case Management Note  Patient Details  Name: Raven Thompson MRN: [id number] Date of Birth: 01-09-1946  Subjective/Objective:                   Met with patient to discuss discharge planning. She states she is independent without any ambulatory assistance from home alone. She states she has "kitties" she takes care of which is when she "slide down but did not fall". She states she has no DME at home including O2. She states she has 3 children and "they are in an out all the time". She states her PCP is Dr. Glendon Axe but states that "they do not take Medicare now"- which is concerning. She states she uses [facility] on [address] for Rx and denies problems obtaining meds. Per ICU progression they plan to transfer patient to floor today. Patient has IVC history, dementia, and paranoia history. She has refused to take some meds while in ICU. Patient still drives. Action/Plan:   I spoke with Dr. Keturah Barre office. Patient is a new patient with them and had an appointment but patient had called them cancelling appointment stating "she did not need to be seen now". Patient has new appointment on Tues. Sept 19 at 1:15P. I have notified [name] patient's emergency contact/daughter [phone] of appointment. She currently has no PCP and therefore unless Dr. Aleene Davidson will agree (without seeing patient) she will not be able to get home health.    Expected Discharge Date:                  Expected Discharge Plan:     In-House Referral:     Discharge planning Services  CM Consult  Post Acute Care Choice:  Home Health, Durable Medical Equipment Choice offered to:  Patient  DME Arranged:    DME Agency:     HH Arranged:    [place] Agency:     Status of Service:  In process, will continue to follow  If discussed at Long Length of Stay Meetings, dates discussed:    Additional Comments:  Marshell Garfinkel, RN 08/13/2016, 12:00 PM

## 2016-08-13 NOTE — Progress Notes (Signed)
1530 Brief improvement in resp. Status. Resp rate still in low to mid 30s. O2 sats remain 97-100% but work of breathing has not decreased even after Morphine 2mg . Given.

## 2016-08-13 NOTE — Progress Notes (Signed)
ANTICOAGULATION CONSULT NOTE - Initial Consult  Pharmacy Consult for Heparin Indication: pulmonary embolus  Allergies  Allergen Reactions  . Sulfa Antibiotics Nausea Only    Patient Measurements: Height: 5\' 3"  (160 cm) Weight: 188 lb 15 oz (85.7 kg) IBW/kg (Calculated) : 52.4 Heparin Dosing Weight: 70.9 kg  Vital Signs: Temp: 98.9 F (37.2 C) (08/24 1200) BP: 143/78 (08/24 1400) Pulse Rate: 84 (08/24 1400)  Labs:  Recent Labs  08/12/16 0857 08/12/16 1323 08/12/16 1641 08/12/16 2025 08/13/16 0008 08/13/16 0804 08/13/16 1630  HGB 12.8 11.5*  --  10.9 11.4*  --   --   HCT 39.5 34.5*  --  32.8 35.0  --   --   PLT 107* 76*  --  68 85*  --   --   APTT 34 81* 58*  --  96*  --   --   LABPROT 16.4* 25.4*  --   --  19.1*  --   --   INR 1.31 2.27  --   --  1.59  --   --   HEPARINUNFRC  --   --   --   --  0.21* 0.21* 0.10*  CREATININE 1.40*  --   --  0.84 0.86  --   --   TROPONINI 0.34*  --   --   --   --   --   --     Estimated Creatinine Clearance: 63.1 mL/min (by C-G formula based on SCr of 0.86 mg/dL).   Medical History: Past Medical History:  Diagnosis Date  . CAD (coronary artery disease)   . COPD (chronic obstructive pulmonary disease) ([place])   . Hypertension   . MI (myocardial infarction) ([place])     Medications:  Scheduled:  . antiseptic oral rinse  7 mL Mouth Rinse BID  . [START ON 08/14/2016] cefTRIAXone (ROCEPHIN)  IV  1 g Intravenous Q24H  . divalproex  500 mg Oral QHS  . insulin aspart  2-6 Units Subcutaneous Q4H  . ipratropium-albuterol  3 mL Nebulization Q6H  . risperiDONE  0.25 mg Oral QHS   Infusions:  . heparin 1,250 Units/hr (08/13/16 0946)    Assessment: 70 y/o F admitted with PE now s/p TPA to begin heparin infusion once aPTT < 80.   Goal of Therapy:  Heparin level 0.3-0.5 x 24 hours then Heparin level 0.3-0.7 units/ml Monitor platelets by anticoagulation protocol: Yes   Plan:  8/23 @ 1323 APTT= 81. Will recheck aPTT in 2 hours and  begin heparin at 14 units/kg/hr without bolus once aPTT < 80.   8/23 @ 1641 APTT= 58. Will begin heparin at 950 units/hr (14units/kg/hr). Will check APTT again in 6 hours. Goal is to maintain heparin level 0.3-0.5 for 1st  24 hours.  8/24 00:00 heparin level 0.21. Increase rate to 1100 units/hour and recheck in 6 hours.  8/24 0900  Heparin level was 0.21. Drip increased to 1250u/hr  8/24 1720 Heparin level 0.10. Nurse reports drip has been going with out interruption. Will increase rate to 1450u/hr. Will recheck level in 6 hours.   Nancy Fetter, PharmD Clinical Pharmacist 08/13/2016 5:51 PM

## 2016-08-13 NOTE — Progress Notes (Signed)
78 Dr. Madalyn Rob rteturned call. Patient placed on BiPAP. Cxray done.

## 2016-08-13 NOTE — Progress Notes (Signed)
[place] now. Sleeping at times. BiPAP on at 28% 12/6 rate of 8.

## 2016-08-13 NOTE — Progress Notes (Signed)
[facility] Progess Note    ASSESSMENT/PLAN    Patient is a 70 year old female with history of paranoia, dementia, delusions,  a month long involuntary psych hospitalization, now presents with acute chest pain and CT chest consistent with acute pulmonary embolism. Course complicated by pulmonary infarct.   PULMONARY A:Acute bilateral pulmonary emboli with acute hypoxic respiratory failure and right heart strain. --Now with cough/bronchitis symptoms likely due to atelectasis and pulmonary infarct.  -Chest x-ray images 8/24: Developing right lower lobe atelectasis, consistent with evolving infarct and atelectasis. -Chronic left pleural effusion, small. -Mediastinal lymphadenopathy, seen on CT of the chest with subcarinal lymph nodes, and extension to the right infrahilar area.  P:   -s/p  TPA. -Continue IV heparin.  --Will not transition to oral anticoagulant yet as I expect that she may develop hemoptysis from the infarct.  --Empiric abx for pneumonia.  --IV pain meds for infarct. -The patient's mediastinal lymphadenopathy may be reactive, however, given the constellation of findings, this needs to be further worked up once her acute issues have resolved.  CARDIOVASCULAR A: Right heart failure, elevated troponin. -EKG changes consistent with right heart strain. -History of coronary artery disease with previous MI. P:  We'll continue to monitor on telemetry. Will consider cardiac consultation before the patient's discharge, she has not seen a cardiologist in some time.  RENAL A:  Mild acute kidney injury, likely prerenal. P:   -Continue IV fluid resuscitation.  GASTROINTESTINAL -GI prophylaxis.  HEMATOLOGIC A:  Submassive PE, likely secondary to recent hospitalization with decreased ambulation. --RLE popliteal DVT.  P:  -Continue heparin.   INFECTIOUS --Possible pneumonia vs infarct, continue empiric abx.   Micro/culture  results:  BCx2 -- UC -- Sputum-- MRSA PCR 8/24: Negative  Antibiotics: Ceftriaxone 8/24>>  ENDOCRINE A:  Hyperglycemia, improving .   P:   -Continue scale insulin.  NEUROLOGIC/PSYCH A:  History of paranoia, delusions, dementia, recent involuntary commitment to psychiatric facility P:   --We'll continue the patient's Depakote and Seroquel as tolerated. -The patient may require addition of a Precedex drip, should her anxiety symptoms become advanced.   Best Practices  DVT Prophylaxis: On IV heparin. GI Prophylaxis: Protonix.  ---------------------------------------   ----------------------------------------   Name: Raven Thompson MRN: [id number] DOB: 13-Sep-1946    ADMISSION DATE:  08/12/2016  STUDIES:  CT chest 8/23: Bilateral PE. Lower extremity Doppler 8/24: Right popliteal DVT.   SUBJECTIVE:   Pt currently in a lot of pain, can not provide history or review of systems.   Review of Systems:  --  VITAL SIGNS: Temp:  [98.2 F (36.8 C)-98.4 F (36.9 C)] 98.2 F (36.8 C) (08/24 0500) Pulse Rate:  [62-124] 69 (08/24 0600) Resp:  [15-49] 31 (08/24 0600) BP: (86-128)/(61-90) 118/63 (08/24 0600) SpO2:  [79 %-100 %] 95 % (08/24 0600) FiO2 (%):  [99 %-100 %] 99 % (08/23 1018) Weight:  [180 lb (81.6 kg)-188 lb 15 oz (85.7 kg)] 188 lb 15 oz (85.7 kg) (08/24 0411) HEMODYNAMICS:   VENTILATOR SETTINGS: FiO2 (%):  [99 %-100 %] 99 % INTAKE / OUTPUT:  Intake/Output Summary (Last 24 hours) at 08/13/16 0724 Last data filed at 08/13/16 0600  Gross per 24 hour  Intake             99.5 ml  Output              925 ml  Net           -825.5 ml    PHYSICAL EXAMINATION:  Physical Examination:   VS: BP 118/63   Pulse 69   Temp 98.2 F (36.8 C) (Oral)   Resp (!) 31   Ht 5\' 3"  (1.6 m)   Wt 188 lb 15 oz (85.7 kg)   SpO2 95%   BMI 33.47 kg/m   General Appearance: In some distress due to pain. Neuro:without focal findings, mental status normal, appears  anxious HEENT: PERRLA, EOM intact. Pulmonary: Decreased breath sounds in right base.  CardiovascularNormal S1,S2.  No m/r/g.   Abdomen: Benign, Soft, non-tender. Renal:  No costovertebral tenderness  GU:  Not performed at this time. Endocrine: No evident thyromegaly. Skin:   warm, no rashes, no ecchymosis  Extremities: normal, no cyanosis, clubbing.   LABS:   LABORATORY PANEL:   CBC  Recent Labs Lab 08/13/16 0008  WBC 11.2*  HGB 11.4*  HCT 35.0  PLT 85*    Chemistries   Recent Labs Lab 08/13/16 0008  NA 139  K 3.8  CL 112*  CO2 21*  GLUCOSE 90  BUN 25*  CREATININE 0.86  CALCIUM 8.1*  MG 2.3  PHOS 3.5     Recent Labs Lab 08/12/16 1410 08/12/16 1656 08/12/16 2001 08/13/16 0019 08/13/16 0409 08/13/16 0721  GLUCAP 124* 124* 124* 88 80 87    Recent Labs Lab 08/12/16 0910  PHART 7.37  PCO2ART 27*  PO2ART 121*   No results for input(s): AST, ALT, ALKPHOS, BILITOT, ALBUMIN in the last 168 hours.  Cardiac Enzymes  Recent Labs Lab 08/12/16 0857  TROPONINI 0.34*    RADIOLOGY:  US Venous Img Lower Bilateral  Result Date: 08/12/2016 CLINICAL DATA:  Pulmonary emboli. EXAM: BILATERAL LOWER EXTREMITY VENOUS DOPPLER ULTRASOUND TECHNIQUE: Gray-scale sonography with compression, as well as color and duplex ultrasound, were performed to evaluate the deep venous system from the level of the common femoral vein through the popliteal and proximal calf veins. COMPARISON:  None FINDINGS: On the right, there is echogenic thrombus in the popliteal vein which is incompletely compressible. A small amount of flow is seen pass this lesion on color Doppler . Normal compressibility of the common femoral, superficial femoral, and proximal calf veins. On the left, No filling defects to suggest DVT on grayscale or color Doppler imaging. Doppler waveforms show normal direction of venous flow, normal respiratory phasicity and response to augmentation. IMPRESSION: 1.  Incompletely occlusive right popliteal DVT. 2. No evidence of left lower extremity DVT. Electronically Signed   By: Lucrezia Europe M.D.   On: 08/12/2016 16:20   Dg Chest Port 1 View  Result Date: 08/13/2016 CLINICAL DATA:  Dyspnea.  The diagnosis of pulmonary embolus. EXAM: PORTABLE CHEST 1 VIEW COMPARISON:  Chest CT yesterday. FINDINGS: Right basilar opacity with mild increase compared to prior CT, may be worsening airspace disease or effusion. Left basilar opacity and pleural effusion, unchanged. The heart size and mediastinal contours are unchanged. Upper lungs are clear. No pneumothorax. IMPRESSION: Worsening right lung base aeration may be worsening airspace opacity or increasing pleural effusion. Unchanged left lung base opacity and pleural effusion. Electronically Signed   By: Jeb Levering M.D.   On: 08/13/2016 06:17   Ct Angio Chest/abd/pel For Dissection W And/or Wo Contrast  Result Date: 08/12/2016 CLINICAL DATA:  Shortness of breath and chest pain beginning this morning. EXAM: CT ANGIOGRAPHY CHEST, ABDOMEN AND PELVIS TECHNIQUE: Multidetector CT imaging through the chest, abdomen and pelvis was performed using the standard protocol during bolus administration of intravenous contrast. Multiplanar reconstructed images and MIPs were obtained and  reviewed to evaluate the vascular anatomy. CONTRAST:  100 cc Isovue 370 COMPARISON:  Abdomen and pelvis CT from 12/27/2008. FINDINGS: CTA CHEST FINDINGS Cardiovascular: Heart size is upper normal. No evidence for hyperdense crescent in the wall of the thoracic aorta to suggest acute intramural hematoma. No evidence for thoracic aortic dissection flap. Patient is noted to have a large volume pulmonary embolus visible in the right main pulmonary artery extending into lobar pulmonary arteries and all 3 lobes of the right lung. Lobar pulmonary embolus is identified in the upper and lower lobes of the left lung as well. The RV LV ratio is calculated at 1.7.  Mediastinum/Nodes: 9 mm short axis subcarinal lymph node is upper normal. 9 mm short axis right hilar lymph node is upper normal. No overt mediastinal lymphadenopathy. No evidence for hilar lymphadenopathy. The esophagus has normal imaging features. There is no axillary lymphadenopathy. Lungs/Pleura: Wedge-shaped peripheral focus of airspace disease in the right middle lobe is assisted with fairly prominent area of alveolar ground-glass attenuation posteriorly in the right lower lobe. There is some left base collapse/ consolidation. Small bilateral pleural effusions noted left greater than right. Musculoskeletal: Bone windows reveal no worrisome lytic or sclerotic osseous lesions. Review of the MIP images confirms the above findings. CTA ABDOMEN AND PELVIS FINDINGS Hepatobiliary: No focal abnormality within the liver parenchyma. There is no evidence for gallstones, gallbladder wall thickening, or pericholecystic fluid. No intrahepatic or extrahepatic biliary dilation. Pancreas: No focal mass lesion. No dilatation of the main duct. No intraparenchymal cyst. No peripancreatic edema. Spleen: No splenomegaly. No focal mass lesion. Adrenals/Urinary Tract: No adrenal nodule or mass. No enhancing abnormality identified in either kidney. No hydronephrosis. No evidence for hydroureter. Bladder is decompressed. Stomach/Bowel: Stomach is nondistended. No gastric wall thickening. No evidence of outlet obstruction. Duodenum is normally positioned as is the ligament of Treitz. No small bowel wall thickening. No small bowel dilatation. The terminal ileum is normal. The appendix is normal. Diverticular changes are noted in the left colon without evidence of diverticulitis. Vascular/Lymphatic: There is abdominal aortic atherosclerosis without aneurysm. No dissection of the abdominal aorta. Calcific plaque identified at the origin of the celiac axis and SMA although no evidence for flow limiting stenosis. The IMA opacifies normally.  Calcific plaque evident at the origin of each single renal artery without evidence for renal artery stenosis. There is no gastrohepatic or hepatoduodenal ligament lymphadenopathy. No intraperitoneal or retroperitoneal lymphadenopathy. No pelvic sidewall lymphadenopathy. Reproductive: Uterus surgically absent.  There is no adnexal mass. Other: No intraperitoneal free fluid. Musculoskeletal: Bone windows reveal no worrisome lytic or sclerotic osseous lesions. Review of the MIP images confirms the above findings. IMPRESSION: 1. Acute bilateral pulmonary embolus with CT evidence of right heart strain (RV/LV ratio equals 1.7) consistent with at least sub massive (intermediate risk) pulmonary embolus. The presence of right heart strain has been associated with an increased risk of morbidity and mortality. Consultation with Pulmonary Critical Care Medicine is recommended. 2. Right-sided pulmonary infarcts with potential associated infarct left lower lobe. 3. Thoracoabdominal aortic atherosclerosis without evidence for dissection or aneurysm. I personally discussed the results by telephone with Dr. Alfred Levins at approximately 0945 hours on 08/12/2016. Electronically Signed   By: Misty Stanley M.D.   On: 08/12/2016 10:07       --Marda Stalker, MD.  ICU Pager: [phone] [facility] Office Number: [phone]  Patricia Pesa, M.D.  Vilinda Boehringer, M.D.  Merton Border, M.D  08/13/2016

## 2016-08-13 NOTE — Progress Notes (Signed)
eLink Physician-Brief Progress Note Patient Name: Raven Thompson DOB: [birth year] MRN: [id number]   Date of Service  08/13/2016  HPI/Events of Note  Respiratory Distress - Increased WOB with RR = 32. Sat now picking up.  eICU Interventions  Will order: 1. BiPAP - IPAP = 12 and EPAP = 5. Titrate FiO2 to keep sat > 93% and titrate IPAP and EPAP per RT.  2. ABG at 5:30 PM. 3. Portable CXR now.      Intervention Category Intermediate Interventions: Respiratory distress - evaluation and management  [name] 08/13/2016, 4:20 PM

## 2016-08-13 NOTE — Care Management (Addendum)
Received call from patient's daughter [name] [phone] stating that she talked to patient about follow up appointment with Dr. Candiss Norse and "she doesn't want to go to see Dr. Candiss Norse- she doesn't like her". She prefers to go to Dr. Sabino Snipes with [facility] [phone].She IS a patient of theirs- had appointment 07/30/16 and she cancelled "regarding chest pain". She will need to go in as a new patient on Sept 6 at 1010AM. Daughter notified.  She states that she would like to "have her sister talk to patient and pick between [facility] or Dr. Gwynneth Aliment". I advised daughter to call me back today with a choice as I have to cancel one of the two appointments.

## 2016-08-13 NOTE — Progress Notes (Signed)
Pt refuses to wear Bipap. She stated it would be like suffocating her and dying. Pt in no apparent distress at this time. Made RN aware. Bipap remains at bedside.

## 2016-08-14 ENCOUNTER — Ambulatory Visit: Payer: 59 | Admitting: Licensed Clinical Social Worker

## 2016-08-14 ENCOUNTER — Inpatient Hospital Stay: Payer: Medicare Other

## 2016-08-14 LAB — CBC
HEMATOCRIT: 30.3 % — AB (ref 35.0–47.0)
Hemoglobin: 10.5 g/dL — ABNORMAL LOW (ref 12.0–16.0)
MCH: 29.7 pg (ref 26.0–34.0)
MCHC: 34.6 g/dL (ref 32.0–36.0)
MCV: 85.8 fL (ref 80.0–100.0)
Platelets: 92 10*3/uL — ABNORMAL LOW (ref 150–440)
RBC: 3.53 MIL/uL — ABNORMAL LOW (ref 3.80–5.20)
RDW: 14.4 % (ref 11.5–14.5)
WBC: 10.6 10*3/uL (ref 3.6–11.0)

## 2016-08-14 LAB — BLOOD GAS, ARTERIAL
ALLENS TEST (PASS/FAIL): POSITIVE — AB
Acid-Base Excess: 0.4 mmol/L (ref 0.0–3.0)
Bicarbonate: 24.6 mEq/L (ref 21.0–28.0)
FIO2: 0.28
O2 Saturation: 96.5 %
PATIENT TEMPERATURE: 37
PO2 ART: 83 mmHg (ref 83.0–108.0)
pCO2 arterial: 37 mmHg (ref 32.0–48.0)
pH, Arterial: 7.43 (ref 7.350–7.450)

## 2016-08-14 LAB — BASIC METABOLIC PANEL
ANION GAP: 7 (ref 5–15)
BUN: 14 mg/dL (ref 6–20)
CALCIUM: 8 mg/dL — AB (ref 8.9–10.3)
CO2: 23 mmol/L (ref 22–32)
Chloride: 106 mmol/L (ref 101–111)
Creatinine, Ser: 0.52 mg/dL (ref 0.44–1.00)
GFR calc Af Amer: >60 mL/min (ref >60)
GFR calc non Af Amer: >60 mL/min (ref >60)
GLUCOSE: 100 mg/dL — AB (ref 65–99)
Potassium: 3.4 mmol/L — ABNORMAL LOW (ref 3.5–5.1)
Sodium: 136 mmol/L (ref 135–145)

## 2016-08-14 LAB — PHOSPHORUS: Phosphorus: 3.3 mg/dL (ref 2.5–4.6)

## 2016-08-14 LAB — MAGNESIUM: Magnesium: 1.8 mg/dL (ref 1.7–2.4)

## 2016-08-14 LAB — PROTIME-INR
INR: 1.25
Prothrombin Time: 15.8 seconds — ABNORMAL HIGH (ref 11.4–15.2)

## 2016-08-14 LAB — HEPARIN LEVEL (UNFRACTIONATED): Heparin Unfractionated: 0.42 IU/mL (ref 0.30–0.70)

## 2016-08-14 MED ORDER — AMOXICILLIN-POT CLAVULANATE 875-125 MG PO TABS
1.0000 | ORAL_TABLET | Freq: Two times a day (BID) | ORAL | Status: DC
  Administered 2016-08-14: 1 via ORAL
  Filled 2016-08-14 (×3): qty 1

## 2016-08-14 MED ORDER — SENNOSIDES-DOCUSATE SODIUM 8.6-50 MG PO TABS
1.0000 | ORAL_TABLET | Freq: Two times a day (BID) | ORAL | Status: DC
  Administered 2016-08-14 (×2): 1 via ORAL
  Filled 2016-08-14 (×3): qty 1

## 2016-08-14 MED ORDER — POLYETHYLENE GLYCOL 3350 17 G PO PACK
17.0000 g | PACK | Freq: Two times a day (BID) | ORAL | Status: DC
  Administered 2016-08-14 (×2): 17 g via ORAL
  Filled 2016-08-14 (×2): qty 1

## 2016-08-14 MED ORDER — RIVAROXABAN 20 MG PO TABS: 20.0000 mg | ORAL_TABLET | Freq: Every day | ORAL | Status: DC

## 2016-08-14 MED ORDER — ALPRAZOLAM 0.25 MG PO TABS
0.2500 mg | ORAL_TABLET | Freq: Three times a day (TID) | ORAL | Status: DC
  Administered 2016-08-14 – 2016-08-15 (×4): 0.25 mg via ORAL
  Filled 2016-08-14 (×4): qty 1

## 2016-08-14 MED ORDER — RIVAROXABAN 15 MG PO TABS
15.0000 mg | ORAL_TABLET | Freq: Two times a day (BID) | ORAL | Status: DC
  Administered 2016-08-14 – 2016-08-15 (×3): 15 mg via ORAL
  Filled 2016-08-14 (×3): qty 1

## 2016-08-14 NOTE — Care Management (Signed)
Received call from patient's daughter [name] [phone] late yesterday 08/13/16 requesting that I speak with patient instead of her about PCP selection. She has requested a Friday appointment so that she or her sibling can take patient to appointment.  I met with patient and again she was very undecided about which PCP she wants. I left a message for Deanna explaining that I was going to leave the decision up to her and her sibling as to their request for Dr. Gwynneth Aliment and a Friday appointment and that I didn't feel that patient could logically make a decision as her story continues to change. I left my call back number for Deanna. Appointment changed to Friday 08/28/16 with Dr. Gwynneth Aliment at [phone]. I have notified Deanna that I have cancelled appointment with Dr. Candiss Norse.

## 2016-08-14 NOTE — Progress Notes (Signed)
ANTICOAGULATION CONSULT NOTE - Initial Consult  Pharmacy Consult for Heparin Indication: pulmonary embolus  Allergies  Allergen Reactions  . Sulfa Antibiotics Nausea Only    Patient Measurements: Height: 5\' 3"  (160 cm) Weight: 188 lb 15 oz (85.7 kg) IBW/kg (Calculated) : 52.4 Heparin Dosing Weight: 70.9 kg  Vital Signs: Temp: 98.5 F (36.9 C) (08/24 1900) Temp Source: Oral (08/24 1900) BP: 151/78 (08/24 1800) Pulse Rate: 81 (08/24 1800)  Labs:  Recent Labs  08/12/16 0857 08/12/16 1323 08/12/16 1641 08/12/16 2025  08/13/16 0008 08/13/16 0804 08/13/16 1630 08/14/16 0109  HGB 12.8 11.5*  --  10.9  --  11.4*  --   --   --   HCT 39.5 34.5*  --  32.8  --  35.0  --   --   --   PLT 107* 76*  --  68  --  85*  --   --   --   APTT 34 81* 58*  --   --  96*  --   --   --   LABPROT 16.4* 25.4*  --   --   --  19.1*  --   --   --   INR 1.31 2.27  --   --   --  1.59  --   --   --   HEPARINUNFRC  --   --   --   --   < > 0.21* 0.21* 0.10* <0.10*  CREATININE 1.40*  --   --  0.84  --  0.86  --   --   --   TROPONINI 0.34*  --   --   --   --   --   --   --   --   < > = values in this interval not displayed.  Estimated Creatinine Clearance: 63.1 mL/min (by C-G formula based on SCr of 0.86 mg/dL).   Medical History: Past Medical History:  Diagnosis Date  . CAD (coronary artery disease)   . COPD (chronic obstructive pulmonary disease) ([place])   . Hypertension   . MI (myocardial infarction) ([place])     Medications:  Scheduled:  . antiseptic oral rinse  7 mL Mouth Rinse BID  . cefTRIAXone (ROCEPHIN)  IV  1 g Intravenous Q24H  . divalproex  500 mg Oral QHS  . ipratropium-albuterol  3 mL Nebulization Q6H  . risperiDONE  0.25 mg Oral QHS   Infusions:  . heparin 1,450 Units/hr (08/13/16 1800)    Assessment: 70 y/o F admitted with PE now s/p TPA to begin heparin infusion once aPTT < 80.   Goal of Therapy:  Heparin level 0.3-0.5 x 24 hours then Heparin level 0.3-0.7  units/ml Monitor platelets by anticoagulation protocol: Yes   Plan:  8/23 @ 1323 APTT= 81. Will recheck aPTT in 2 hours and begin heparin at 14 units/kg/hr without bolus once aPTT < 80.   8/23 @ 1641 APTT= 58. Will begin heparin at 950 units/hr (14units/kg/hr). Will check APTT again in 6 hours. Goal is to maintain heparin level 0.3-0.5 for 1st  24 hours.  8/24 00:00 heparin level 0.21. Increase rate to 1100 units/hour and recheck in 6 hours.  8/24 0900  Heparin level was 0.21. Drip increased to 1250u/hr  8/24 1720 Heparin level 0.10. Nurse reports drip has been going with out interruption. Will increase rate to 1450u/hr. Will recheck level in 6 hours.   8/25 01:00 Heparin level <0.1. Increased to 1700 units/hr. Recheck in 6 hours.  No issues  with infusion per RN.  Nancy Fetter, PharmD Clinical Pharmacist 08/14/2016 2:50 AM

## 2016-08-14 NOTE — Progress Notes (Addendum)
Pt alert and oriented with c/o generalized ABD/thoracic pain. Pt states that it feels achy all over and non-specific in location- 5/10. Night shift reported that the pt refused Bi-pap overnight. Pt remained on 2LNC overnight. Currently sating in upper 90s on RA. NSR on cardiac monitor.  Upon initial assessment-pt 18g L upper arm IV appeared to be infiltrated. IV with good blood return-possible leakage. L upper arm with swelling, bruising, tenderness, and warm to touch. Heparin was infusing. Heparin was paused and moved to 20g Right AC. Warm compress applied and site elevated. Daughter from [place] called with concerns of pt complaining at home of abd pain and was asking to have her checked out for gallstones. Pt ate breakfast and must be NPO x 6 hrs. Orders to keep pt NPO overnight with RUQ [place] in am.  Pt with orders to transfer to floor.

## 2016-08-14 NOTE — Progress Notes (Signed)
ANTICOAGULATION CONSULT NOTE - Initial Consult  Pharmacy Consult for Rivaroxaban Indication: PE  Allergies  Allergen Reactions  . Sulfa Antibiotics Nausea Only    Patient Measurements: Height: 5\' 3"  (160 cm) Weight: 188 lb 15 oz (85.7 kg) IBW/kg (Calculated) : 52.4 Heparin Dosing Weight:   Vital Signs: Temp: 97.8 F (36.6 C) (08/25 1137) Temp Source: Oral (08/25 0800) BP: 134/64 (08/25 1137) Pulse Rate: 81 (08/25 1137)  Labs:  Recent Labs  08/12/16 0857 08/12/16 1323 08/12/16 1641 08/12/16 2025 08/13/16 0008  08/13/16 1630 08/14/16 0109 08/14/16 0623 08/14/16 0937  HGB 12.8 11.5*  --  10.9 11.4*  --   --   --  10.5*  --   HCT 39.5 34.5*  --  32.8 35.0  --   --   --  30.3*  --   PLT 107* 76*  --  68 85*  --   --   --  92*  --   APTT 34 81* 58*  --  96*  --   --   --   --   --   LABPROT 16.4* 25.4*  --   --  19.1*  --   --   --  15.8*  --   INR 1.31 2.27  --   --  1.59  --   --   --  1.25  --   HEPARINUNFRC  --   --   --   --  0.21*  < > 0.10* <0.10*  --  0.42  CREATININE 1.40*  --   --  0.84 0.86  --   --   --  0.52  --   TROPONINI 0.34*  --   --   --   --   --   --   --   --   --   < > = values in this interval not displayed.  Estimated Creatinine Clearance: 67.9 mL/min (by C-G formula based on SCr of 0.8 mg/dL).   Medical History: Past Medical History:  Diagnosis Date  . CAD (coronary artery disease)   . COPD (chronic obstructive pulmonary disease) ([place])   . Hypertension   . MI (myocardial infarction) ([place])     Medications:  Prescriptions Prior to Admission  Medication Sig Dispense Refill Last Dose  . divalproex (DEPAKOTE) 500 MG DR tablet Take 1 tablet (500 mg total) by mouth at bedtime. 30 tablet 0 unknown at unknown  . risperiDONE (RISPERDAL) 0.5 MG tablet Take 1 tablet (0.5 mg total) by mouth 2 (two) times daily. 60 tablet 0 unknown at unknown   Scheduled:  . ALPRAZolam  0.25 mg Oral TID  . antiseptic oral rinse  7 mL Mouth Rinse BID  .  cefTRIAXone (ROCEPHIN)  IV  1 g Intravenous Q24H  . divalproex  500 mg Oral QHS  . ipratropium-albuterol  3 mL Nebulization Q6H  . polyethylene glycol  17 g Oral BID  . risperiDONE  0.25 mg Oral QHS  . Rivaroxaban  15 mg Oral BID WC  . [START ON 09/04/2016] rivaroxaban  20 mg Oral Daily  . senna-docusate  1 tablet Oral BID    Assessment: Pharmacy consulted to dose and monitor Rivaroxaban therapy in this 70 year old woman. Patient previously on heparin gtt Goal of Therapy:      Plan:  Will give rivaroxaban 15 mg PO BID x 21 then 20 mg daily thereafter.    Semaj Coburn D 08/14/2016,11:56 AM

## 2016-08-14 NOTE — Progress Notes (Signed)
[facility] [place] NAME: Raven Thompson    MR#:  [id number]  DATE OF BIRTH:  August 18, 1946  SUBJECTIVE:  CHIEF COMPLAINT:   Chief Complaint  Patient presents with  . Shortness of Breath  . Chest Pain  feels sob (more subjective), not feeling quite right yet REVIEW OF SYSTEMS:  Review of Systems  Constitutional: Negative for chills, fever and weight loss.  HENT: Negative for nosebleeds and sore throat.   Eyes: Negative for blurred vision.  Respiratory: Positive for shortness of breath. Negative for cough and wheezing.   Cardiovascular: Negative for chest pain, orthopnea, leg swelling and PND.  Gastrointestinal: Negative for abdominal pain, constipation, diarrhea, heartburn, nausea and vomiting.  Genitourinary: Negative for dysuria and urgency.  Musculoskeletal: Negative for back pain.  Skin: Negative for rash.  Neurological: Negative for dizziness, speech change, focal weakness and headaches.  Endo/Heme/Allergies: Does not bruise/bleed easily.  Psychiatric/Behavioral: Negative for depression.   DRUG ALLERGIES:   Allergies  Allergen Reactions  . Sulfa Antibiotics Nausea Only   VITALS:  Blood pressure 134/64, pulse 81, temperature 97.8 F (36.6 C), resp. rate 19, height 5\' 3"  (1.6 m), weight 85.7 kg (188 lb 15 oz), SpO2 95 %. PHYSICAL EXAMINATION:  Physical Exam  Constitutional: She is oriented to person, place, and time and well-developed, well-nourished, and in no distress.  HENT:  Head: Normocephalic and atraumatic.  Eyes: Conjunctivae and EOM are normal. Pupils are equal, round, and reactive to light.  Neck: Normal range of motion. Neck supple. No tracheal deviation present. No thyromegaly present.  Cardiovascular: Normal rate, regular rhythm and normal heart sounds.   Pulmonary/Chest: Effort normal and breath sounds normal. No respiratory distress. She has no wheezes. She exhibits no tenderness.  Abdominal: Soft. Bowel sounds  are normal. She exhibits no distension. There is no tenderness.  Musculoskeletal: Normal range of motion.  Neurological: She is alert and oriented to person, place, and time. No cranial nerve deficit.  Skin: Skin is warm and dry. No rash noted.  Psychiatric: Mood and affect normal.   LABORATORY PANEL:   CBC  Recent Labs Lab 08/14/16 0623  WBC 10.6  HGB 10.5*  HCT 30.3*  PLT 92*   ------------------------------------------------------------------------------------------------------------------ Chemistries   Recent Labs Lab 08/14/16 0623  NA 136  K 3.4*  CL 106  CO2 23  GLUCOSE 100*  BUN 14  CREATININE 0.52  CALCIUM 8.0*  MG 1.8   RADIOLOGY:  Dg Chest 1 View  Result Date: 08/14/2016 CLINICAL DATA:  Shortness of Breath EXAM: CHEST 1 VIEW COMPARISON:  August 13, 2016 FINDINGS: There are persistent bilateral pleural effusions with patchy bibasilar atelectatic change. There is consolidation in the medial left base, stable. No new opacity. Heart is upper normal in size with pulmonary vascularity within normal limits. No adenopathy. There is atherosclerotic calcification in the aortic arch. IMPRESSION: Persistent medial left base consolidation with patchy bibasilar atelectasis and bilateral pleural effusions. No new opacity. Stable cardiac silhouette. There is aortic atherosclerosis. Electronically Signed   By: Lowella Grip III M.D.   On: 08/14/2016 07:01   Dg Chest Port 1 View  Result Date: 08/13/2016 CLINICAL DATA:  Hypoxia.  Subsequent encounter. EXAM: PORTABLE CHEST 1 VIEW COMPARISON:  08/13/2016 at 5:45 a.m. FINDINGS: The hazy opacity at the right lung base is without change from the earlier study consistent with a combination of pleural fluid with either atelectasis or infiltrate/pneumonia. Left pleural effusion and associated basilar lung opacity is also stable.  No new lung abnormalities. No convincing pulmonary edema. No pneumothorax. Cardiac silhouette is normal in  size. No mediastinal or hilar masses. IMPRESSION: 1. No significant change from the earlier study. 2. Bilateral pleural effusions with associated parenchymal lung base opacity. Lung opacity may reflect atelectasis, infection or combination. No convincing pulmonary edema. Electronically Signed   By: Lajean Manes M.D.   On: 08/13/2016 16:57   ASSESSMENT AND PLAN:  70 year old female with history of paranoia, dementia, delusions,  a month long involuntary psych hospitalization, Admitted with acute chest pain and CT chest consistent with acute pulmonary embolism.  * Acute PE - s/p TPA on admission, Was on heparin drip which is transitioned to PO xarelto today - monitor  * LLL pna - change iv to PO augmentin to finish total 5 days course  * Hypokalemia - replete and recheck  * History of paranoia, delusions, dementia, recent involuntary commitment to psychiatric facility - continue Depakote and Seroquel     All the records are reviewed and case discussed with Care Management/Social Worker. Management plans discussed with the patient, family and they are in agreement.  CODE STATUS: FULL CODE  TOTAL TIME TAKING CARE OF THIS PATIENT: 35 minutes.   More than 50% of the time was spent in counseling/coordination of care: YES  POSSIBLE D/C IN 1-2 DAYS, DEPENDING ON CLINICAL CONDITION.   [facility], Ellianne Gowen M.D on 08/14/2016 at 4:52 PM  Between 7am to 6pm - Pager - [phone]  After 6pm go to [email or web address] - Proofreader  [facility]  Office  [phone]  CC: Primary care physician; Glendon Axe, MD  Note: This dictation was prepared with Dragon dictation along with smaller phrase technology. Any transcriptional errors that result from this process are unintentional.

## 2016-08-15 ENCOUNTER — Inpatient Hospital Stay: Payer: Medicare Other

## 2016-08-15 LAB — BASIC METABOLIC PANEL
Anion gap: 8 (ref 5–15)
BUN: 13 mg/dL (ref 6–20)
CO2: 23 mmol/L (ref 22–32)
CREATININE: 0.53 mg/dL (ref 0.44–1.00)
Calcium: 8 mg/dL — ABNORMAL LOW (ref 8.9–10.3)
Chloride: 107 mmol/L (ref 101–111)
GFR calc Af Amer: >60 mL/min (ref >60)
Glucose, Bld: 99 mg/dL (ref 65–99)
POTASSIUM: 3.3 mmol/L — AB (ref 3.5–5.1)
SODIUM: 138 mmol/L (ref 135–145)

## 2016-08-15 LAB — CBC
HCT: 28.9 % — ABNORMAL LOW (ref 35.0–47.0)
Hemoglobin: 10 g/dL — ABNORMAL LOW (ref 12.0–16.0)
MCH: 29.4 pg (ref 26.0–34.0)
MCHC: 34.7 g/dL (ref 32.0–36.0)
MCV: 84.8 fL (ref 80.0–100.0)
PLATELETS: 113 10*3/uL — AB (ref 150–440)
RBC: 3.4 MIL/uL — AB (ref 3.80–5.20)
RDW: 14.7 % — ABNORMAL HIGH (ref 11.5–14.5)
WBC: 8.2 10*3/uL (ref 3.6–11.0)

## 2016-08-15 LAB — PROTIME-INR
INR: 1.57
PROTHROMBIN TIME: 18.9 s — AB (ref 11.4–15.2)

## 2016-08-15 MED ORDER — AMOXICILLIN-POT CLAVULANATE 875-125 MG PO TABS: 1.0000 | ORAL_TABLET | Freq: Two times a day (BID) | ORAL | 0 refills | Status: AC

## 2016-08-15 MED ORDER — RIVAROXABAN 20 MG PO TABS: 20.0000 mg | ORAL_TABLET | Freq: Every day | ORAL | 0 refills | Status: DC

## 2016-08-15 MED ORDER — RIVAROXABAN (XARELTO) VTE STARTER PACK (15 & 20 MG): ORAL_TABLET | ORAL | 0 refills | Status: DC

## 2016-08-15 MED ORDER — IPRATROPIUM-ALBUTEROL 0.5-2.5 (3) MG/3ML IN SOLN: 3.0000 mL | Freq: Two times a day (BID) | RESPIRATORY_TRACT | Status: DC

## 2016-08-15 MED ORDER — RIVAROXABAN 15 MG PO TABS: 15.0000 mg | ORAL_TABLET | Freq: Two times a day (BID) | ORAL | 0 refills | Status: DC

## 2016-08-15 NOTE — Progress Notes (Signed)
Pt has not voided on shift. Helped pt to Raven Thompson in an attemp to void but was unsuccessful. Pt shows urine retention of 338 ml on bladder scan, pt states that she will eventually urinate because "everythig is slow with me anyways" will continue to monitor.

## 2016-08-15 NOTE — Progress Notes (Signed)
[facility] H&P    ASSESSMENT/PLAN   Patient is a 70 year old female with history of paranoia, dementia, delusions,  a month long involuntary psych hospitalization, now presents with acute chest pain and CT chest consistent with acute pulmonary embolism.   A:Acute bilateral pulmonary emboli with acute hypoxic respiratory failure. -s/p TPA. -Has Been transitioned from IV heparin to Xarelto. -Currently doing well, has been on RA for about 24 hours now. Ok to discharge from respiratory standpoing.  -We'll need to continue anticoagulation for 3-6 months. The patient will need to follow up with [place] outpatient in approximately 3 months, and we will determine further course of treatment.  Possible pneumonia, with pulmonary infarction  -Continue antibiotics to complete a seven-day course.   Right heart failure, elevated troponin. --Appears improved.   History of paranoia, delusions, dementia, recent involuntary commitment to psychiatric facility P:   -Psychiatry consulted.   ---------------------------------------  ---------------------------------------   Name: Raven Thompson MRN: [id number] DOB: January 06, 1946    ADMISSION DATE:  08/12/2016  CHIEF COMPLAINT:  Dyspnea  Interim history.    Patient is resting comfortably, no new complaints at this time.   REVIEW OF SYSTEMS:   Patient denies chest pain, PND, orthopnea, the remainder of the review systems was reviewed and found to be negative.   VITAL SIGNS: Temp:  [97.8 F (36.6 C)-100.3 F (37.9 C)] 100.2 F (37.9 C) (08/26 0550) Pulse Rate:  [77-81] 79 (08/26 0550) Resp:  [16-24] 18 (08/26 0550) BP: (119-140)/(59-75) 120/70 (08/26 0550) SpO2:  [94 %-100 %] 94 % (08/26 0820) Weight:  [181 lb 8 oz (82.3 kg)] 181 lb 8 oz (82.3 kg) (08/25 1137) HEMODYNAMICS:   VENTILATOR SETTINGS:   INTAKE / OUTPUT:  Intake/Output Summary (Last 24 hours) at 08/15/16 0931 Last data filed at 08/15/16 0849  Gross  per 24 hour  Intake              308 ml  Output              800 ml  Net             -492 ml    Physical Examination:   VS: BP 120/70 (BP Location: Right Arm)   Pulse 79   Temp 100.2 F (37.9 C) (Oral)   Resp 18   Ht 5\' 4"  (1.626 m)   Wt 181 lb 8 oz (82.3 kg)   SpO2 94%   BMI 31.15 kg/m   General Appearance: Tachypneic, anxious Neuro:without focal findings,  HEENT: PERRLA, EOM intact,  Pulmonary: normal breath sounds., Reduced bilaterally. CardiovascularNormal S1,S2.  No m/r/g.    Abdomen: Benign, Soft, non-tender,  Renal:  No costovertebral tenderness  GU:  Not performed at this time. Endoc: No evident thyromegaly, no signs of acromegaly. Skin:   warm, no rashes, no ecchymosis  Extremities: normal, no cyanosis, clubbing, no edema, warm with normal capillary refill.    LABS: Reviewed   LABORATORY PANEL:   CBC  Recent Labs Lab 08/15/16 0500  WBC 8.2  HGB 10.0*  HCT 28.9*  PLT 113*    Chemistries   Recent Labs Lab 08/14/16 0623 08/15/16 0500  NA 136 138  K 3.4* 3.3*  CL 106 107  CO2 23 23  GLUCOSE 100* 99  BUN 14 13  CREATININE 0.52 0.53  CALCIUM 8.0* 8.0*  MG 1.8  --   PHOS 3.3  --      Recent Labs Lab 08/12/16 2001 08/13/16 0019 08/13/16 0409 08/13/16 [id number]  08/13/16 1205 08/13/16 1639  GLUCAP 124* 88 80 87 78 79    Recent Labs Lab 08/12/16 0910 08/13/16 1730 08/14/16 0558  PHART 7.37 7.44 7.43  PCO2ART 27* 32 37  PO2ART 121* 83 83   No results for input(s): AST, ALT, ALKPHOS, BILITOT, ALBUMIN in the last 168 hours.  Cardiac Enzymes  Recent Labs Lab 08/12/16 0857  TROPONINI 0.34*    RADIOLOGY:  Dg Chest 1 View  Result Date: 08/14/2016 CLINICAL DATA:  Shortness of Breath EXAM: CHEST 1 VIEW COMPARISON:  August 13, 2016 FINDINGS: There are persistent bilateral pleural effusions with patchy bibasilar atelectatic change. There is consolidation in the medial left base, stable. No new opacity. Heart is upper normal in size  with pulmonary vascularity within normal limits. No adenopathy. There is atherosclerotic calcification in the aortic arch. IMPRESSION: Persistent medial left base consolidation with patchy bibasilar atelectasis and bilateral pleural effusions. No new opacity. Stable cardiac silhouette. There is aortic atherosclerosis. Electronically Signed   By: Lowella Grip III M.D.   On: 08/14/2016 07:01   Dg Chest Port 1 View  Result Date: 08/13/2016 CLINICAL DATA:  Hypoxia.  Subsequent encounter. EXAM: PORTABLE CHEST 1 VIEW COMPARISON:  08/13/2016 at 5:45 a.m. FINDINGS: The hazy opacity at the right lung base is without change from the earlier study consistent with a combination of pleural fluid with either atelectasis or infiltrate/pneumonia. Left pleural effusion and associated basilar lung opacity is also stable. No new lung abnormalities. No convincing pulmonary edema. No pneumothorax. Cardiac silhouette is normal in size. No mediastinal or hilar masses. IMPRESSION: 1. No significant change from the earlier study. 2. Bilateral pleural effusions with associated parenchymal lung base opacity. Lung opacity may reflect atelectasis, infection or combination. No convincing pulmonary edema. Electronically Signed   By: Lajean Manes M.D.   On: 08/13/2016 16:57       --Marda Stalker, MD.  Board Certified in Internal Medicine, Pulmonary Medicine, [place], and Sleep Medicine.  ICU Pager [phone] [facility] Office Number: [phone]  Patricia Pesa, M.D.  Vilinda Boehringer, M.D.  Merton Border, M.D   08/15/2016, 9:31 AM

## 2016-08-15 NOTE — Discharge Summary (Signed)
[place] at [place] NAME: Raven Thompson    MR#:  [id number]  DATE OF BIRTH:  28-Feb-1946  DATE OF ADMISSION:  08/12/2016 ADMITTING PHYSICIAN: Laverle Hobby, MD  DATE OF DISCHARGE: 08/15/16  PRIMARY CARE PHYSICIAN: Singh,Jasmine, MD    ADMISSION DIAGNOSIS:  Shock ([place]) [R57.9] Pulmonary emboli ([place]) [I26.99] Other acute pulmonary embolism with acute cor pulmonale (H[facility]) [I26.09]  DISCHARGE DIAGNOSIS:  Active Problems:   Pulmonary embolism ([place]) Community acquired pneumonia   SECONDARY DIAGNOSIS:   Past Medical History:  Diagnosis Date  . CAD (coronary artery disease)   . COPD (chronic obstructive pulmonary disease) ([place])   . Hypertension   . MI (myocardial infarction) [facility])     HOSPITAL COURSE:  Raven Thompson  is a 70 y.o. female admitted 08/12/2016 with chief complaint Shortness of Breath and Chest Pain . Please see H&P performed by Laverle Hobby, MD for further information. Patient presented with the above symptoms. Found to have pulmonary embolism, actually underwent tPA infusion in ED with improvement. Transitioned to oral anticoagulants and has been doing well on room air for over 24 hours.   DISCHARGE CONDITIONS:   stable  CONSULTS OBTAINED:    DRUG ALLERGIES:   Allergies  Allergen Reactions  . Sulfa Antibiotics Nausea Only    DISCHARGE MEDICATIONS:   Current Discharge Medication List    START taking these medications   Details  amoxicillin-clavulanate (AUGMENTIN) 875-125 MG tablet Take 1 tablet by mouth every 12 (twelve) hours. Qty: 10 tablet, Refills: 0    Rivaroxaban 15 & 20 MG TBPK Take as directed on package: Start with one 15mg  tablet by mouth twice a day with food. On Day 22, switch to one 20mg  tablet once a day with food. Qty: 51 each, Refills: 0      CONTINUE these medications which have NOT CHANGED   Details  divalproex (DEPAKOTE) 500 MG DR tablet Take 1 tablet (500 mg total) by  mouth at bedtime. Qty: 30 tablet, Refills: 0    risperiDONE (RISPERDAL) 0.5 MG tablet Take 1 tablet (0.5 mg total) by mouth 2 (two) times daily. Qty: 60 tablet, Refills: 0         DISCHARGE INSTRUCTIONS:    DIET:  Cardiac diet  DISCHARGE CONDITION:  Stable  ACTIVITY:  Activity as tolerated  OXYGEN:  Home Oxygen: No.   Oxygen Delivery: room air  DISCHARGE LOCATION:  home   If you experience worsening of your admission symptoms, develop shortness of breath, life threatening emergency, suicidal or homicidal thoughts you must seek medical attention immediately by calling 911 or calling your MD immediately  if symptoms less severe.  You Must read complete instructions/literature along with all the possible adverse reactions/side effects for all the Medicines you take and that have been prescribed to you. Take any new Medicines after you have completely understood and accpet all the possible adverse reactions/side effects.   Please note  You were cared for by a hospitalist during your hospital stay. If you have any questions about your discharge medications or the care you received while you were in the hospital after you are discharged, you can call the unit and asked to speak with the hospitalist on call if the hospitalist that took care of you is not available. Once you are discharged, your primary care physician will handle any further medical issues. Please note that NO REFILLS for any discharge medications will be authorized once you are discharged, as it is imperative  that you return to your primary care physician (or establish a relationship with a primary care physician if you do not have one) for your aftercare needs so that they can reassess your need for medications and monitor your lab values.    On the day of Discharge:   VITAL SIGNS:  Blood pressure 120/70, pulse 79, temperature 100.2 F (37.9 C), temperature source Oral, resp. rate 18, height 5\' 4"  (1.626 m),  weight 82.3 kg (181 lb 8 oz), SpO2 94 %.  I/O:   Intake/Output Summary (Last 24 hours) at 08/15/16 1014 Last data filed at 08/15/16 0849  Gross per 24 hour  Intake              308 ml  Output              800 ml  Net             -492 ml    PHYSICAL EXAMINATION:  GENERAL:  70 y.o.-year-old patient lying in the bed with no acute distress.  EYES: Pupils equal, round, reactive to light and accommodation. No scleral icterus. Extraocular muscles intact.  HEENT: Head atraumatic, normocephalic. Oropharynx and nasopharynx clear.  NECK:  Supple, no jugular venous distention. No thyroid enlargement, no tenderness.  LUNGS: Normal breath sounds bilaterally, no wheezing, rales,rhonchi or crepitation. No use of accessory muscles of respiration.  CARDIOVASCULAR: S1, S2 normal. No murmurs, rubs, or gallops.  ABDOMEN: Soft, non-tender, non-distended. Bowel sounds present. No organomegaly or mass.  EXTREMITIES: No pedal edema, cyanosis, or clubbing.  NEUROLOGIC: Cranial nerves II through XII are intact. Muscle strength 5/5 in all extremities. Sensation intact. Gait not checked.  PSYCHIATRIC: The patient is alert and oriented x 3.  SKIN: No obvious rash, lesion, or ulcer.   DATA REVIEW:   CBC  Recent Labs Lab 08/15/16 0500  WBC 8.2  HGB 10.0*  HCT 28.9*  PLT 113*    Chemistries   Recent Labs Lab 08/14/16 0623 08/15/16 0500  NA 136 138  K 3.4* 3.3*  CL 106 107  CO2 23 23  GLUCOSE 100* 99  BUN 14 13  CREATININE 0.52 0.53  CALCIUM 8.0* 8.0*  MG 1.8  --     Cardiac Enzymes  Recent Labs Lab 08/12/16 0857  TROPONINI 0.34*    Microbiology Results  Results for orders placed or performed during the hospital encounter of 08/12/16  MRSA PCR Screening     Status: None   Collection Time: 08/12/16  1:00 PM  Result Value Ref Range Status   MRSA by PCR NEGATIVE NEGATIVE Final    Comment:        The GeneXpert MRSA Assay (FDA approved for NASAL specimens only), is one component of  a comprehensive MRSA colonization surveillance program. It is not intended to diagnose MRSA infection nor to guide or monitor treatment for MRSA infections.     RADIOLOGY:  Dg Chest 1 View  Result Date: 08/14/2016 CLINICAL DATA:  Shortness of Breath EXAM: CHEST 1 VIEW COMPARISON:  August 13, 2016 FINDINGS: There are persistent bilateral pleural effusions with patchy bibasilar atelectatic change. There is consolidation in the medial left base, stable. No new opacity. Heart is upper normal in size with pulmonary vascularity within normal limits. No adenopathy. There is atherosclerotic calcification in the aortic arch. IMPRESSION: Persistent medial left base consolidation with patchy bibasilar atelectasis and bilateral pleural effusions. No new opacity. Stable cardiac silhouette. There is aortic atherosclerosis. Electronically Signed   By: Lowella Grip III M.D.  On: 08/14/2016 07:01   Dg Chest Port 1 View  Result Date: 08/13/2016 CLINICAL DATA:  Hypoxia.  Subsequent encounter. EXAM: PORTABLE CHEST 1 VIEW COMPARISON:  08/13/2016 at 5:45 a.m. FINDINGS: The hazy opacity at the right lung base is without change from the earlier study consistent with a combination of pleural fluid with either atelectasis or infiltrate/pneumonia. Left pleural effusion and associated basilar lung opacity is also stable. No new lung abnormalities. No convincing pulmonary edema. No pneumothorax. Cardiac silhouette is normal in size. No mediastinal or hilar masses. IMPRESSION: 1. No significant change from the earlier study. 2. Bilateral pleural effusions with associated parenchymal lung base opacity. Lung opacity may reflect atelectasis, infection or combination. No convincing pulmonary edema. Electronically Signed   By: Lajean Manes M.D.   On: 08/13/2016 16:57     Management plans discussed with the patient, family and they are in agreement.  CODE STATUS:     Code Status Orders        Start     Ordered    08/12/16 1052  Full code  Continuous     08/12/16 1058    Code Status History    Date Active Date Inactive Code Status Order ID Comments User Context   05/27/2016  3:57 PM 05/28/2016  5:43 PM Full Code [id number]  Lacretia Leigh, MD ED      TOTAL TIME TAKING CARE OF THIS PATIENT: 33 minutes.    Hower,  Karenann Cai.D on 08/15/2016 at 10:14 AM  Between 7am to 6pm - Pager - [phone]  After 6pm go to [email or web address] - Proofreader  [facility]  Office  [phone]  CC: Primary care physician; Glendon Axe, MD

## 2016-08-17 ENCOUNTER — Encounter: Payer: Self-pay | Admitting: Psychiatry

## 2016-08-17 ENCOUNTER — Ambulatory Visit (INDEPENDENT_AMBULATORY_CARE_PROVIDER_SITE_OTHER): Payer: 59 | Admitting: Licensed Clinical Social Worker

## 2016-08-17 ENCOUNTER — Ambulatory Visit (INDEPENDENT_AMBULATORY_CARE_PROVIDER_SITE_OTHER): Payer: 59 | Admitting: Psychiatry

## 2016-08-17 VITALS — BP 160/85 | HR 87 | Temp 98.6°F | Ht 64.0 in | Wt 184.4 lb

## 2016-08-17 DIAGNOSIS — F03918 Unspecified dementia, unspecified severity, with other behavioral disturbance: Secondary | ICD-10-CM

## 2016-08-17 DIAGNOSIS — F22 Delusional disorders: Secondary | ICD-10-CM

## 2016-08-17 DIAGNOSIS — F0391 Unspecified dementia with behavioral disturbance: Secondary | ICD-10-CM | POA: Diagnosis not present

## 2016-08-17 NOTE — Progress Notes (Signed)
Psychiatric MD Progress Note  Patient Identification: Raven Thompson MRN:  [id number] Date of Evaluation:  08/17/2016 Referral Source: [place]  Chief Complaint:   Chief Complaint    Follow-up; Medication Refill     Visit Diagnosis:    ICD-9-CM ICD-10-CM   1. Delusional disorder ([place]) 297.1 F22   2. Dementia with behavioral disturbance 294.21 F03.91     History of Present Illness:    Patient is a 70 year old female with history of delusional disorder Presented for the follow-up. Patient was recently admitted to the medical floor after she was diagnosed with having pulmonary embolism. She stayed there for 2 days. Patient reported that she is compliant with her medications. She appeared more calm and alert during the interview. She reported that she is not having abdominal pain at this time. She reported that she still hurts in her belly but it is not as severe. She reported that she is taking a big pill as she was referring to Depakote.   who was recently discharged from the [facility] presented for Follow-up. She continues to have mild  tremors in her head and they are improving with each visit. Patient currently denied having any paranoia. She does not have any delusional thinking as well. She is improving on her current medications.  She is currently on outpatient commitment for medication management.    She currently denied having any suicidal homicidal ideations or plans.   Associated Signs/Symptoms: Depression Symptoms:  fatigue, feelings of worthlessness/guilt, difficulty concentrating, impaired memory, (Hypo) Manic Symptoms:  Hallucinations, Anxiety Symptoms:  Excessive Worry, Psychotic Symptoms:  Delusions, Paranoia, PTSD Symptoms: Negative NA  Past Psychiatric History:  Recent admission to [facility]. Patient was discharged on a combination of Depakote 500 twice a day and Risperdal 1 mg twice a day. She is currently on 180 days outpatient  commitment  Previous Psychotropic Medications:  Risperdal 1 mg twice a day Depakote 500 mg twice a day Patient does not have any previous history of suicide attempts.  Substance Abuse History in the last 12 months:  No.  Consequences of Substance Abuse: Negative NA  Past Medical History:  Past Medical History:  Diagnosis Date  . CAD (coronary artery disease)   . COPD (chronic obstructive pulmonary disease) ([place])   . Hypertension   . MI (myocardial infarction) [facility])     Past Surgical History:  Procedure Laterality Date  . ABDOMINAL HYSTERECTOMY    . CORONARY ANGIOPLASTY WITH STENT PLACEMENT    . TONSILLECTOMY      Family Psychiatric History:  Patient does not have any family history of psychiatric illness.  Family History: History reviewed. No pertinent family history.  Social History:   Social History   Social History  . Marital status: Single    Spouse name: N/A  . Number of children: N/A  . Years of education: N/A   Social History Main Topics  . Smoking status: Current Some Day Smoker    Types: Cigarettes  . Smokeless tobacco: Never Used  . Alcohol use No  . Drug use: No  . Sexual activity: Not Currently    Birth control/ protection: Surgical   Other Topics Concern  . None   Social History Narrative  . None    Additional Social History:  Lives in [place] by herself. She was married 3 times in the past. He has 3 daughters and they are supportive. Has 10 grandchildren.   Allergies:   Allergies  Allergen Reactions  . Sulfa Antibiotics Nausea Only  Metabolic Disorder Labs: No results found for: HGBA1C, MPG No results found for: PROLACTIN No results found for: CHOL, TRIG, HDL, CHOLHDL, VLDL, LDLCALC   Current Medications: Current Outpatient Prescriptions  Medication Sig Dispense Refill  . amoxicillin-clavulanate (AUGMENTIN) 875-125 MG tablet Take 1 tablet by mouth every 12 (twelve) hours. 10 tablet 0  . divalproex (DEPAKOTE) 500 MG DR tablet  Take 1 tablet (500 mg total) by mouth at bedtime. 30 tablet 0  . risperiDONE (RISPERDAL) 0.5 MG tablet Take 1 tablet (0.5 mg total) by mouth 2 (two) times daily. 60 tablet 0  . Rivaroxaban 15 & 20 MG TBPK Take as directed on package: Start with one 15mg  tablet by mouth twice a day with food. On Day 22, switch to one 20mg  tablet once a day with food. 51 each 0   No current facility-administered medications for this visit.     Neurologic: Headache: No Seizure: No Paresthesias:No  Musculoskeletal: Strength & Muscle Tone: within normal limits Gait & Station: normal Patient leans: N/A  Psychiatric Specialty Exam: Review of Systems  Constitutional: Positive for malaise/fatigue.  Gastrointestinal: Positive for abdominal pain and diarrhea.  Musculoskeletal: Positive for neck pain.  Neurological: Positive for headaches.    Blood pressure (!) 160/85, pulse 87, temperature 98.6 F (37 C), temperature source Oral, height 5\' 4"  (1.626 m), weight 184 lb 6.4 oz (83.6 kg).Body mass index is 31.65 kg/m.  General Appearance: Casual  Eye Contact:  Fair  Speech:  Clear and Coherent  Volume:  Decreased  Mood:  Anxious  Affect:  Congruent  Thought Process:  Disorganized  Orientation:  Full (Time, Place, and Person)  Thought Content:  WDL  Suicidal Thoughts:  No  Homicidal Thoughts:  No  Memory:  Immediate;   Poor  Judgement:  Fair  Insight:  Lacking  Psychomotor Activity:  Decreased  Concentration:  Concentration: Fair and Attention Span: Fair  Recall:  [employer] of Knowledge:Fair  Language: Fair  Akathisia:  No  Handed:  Right  AIMS (if indicated):    Assets:  Chief Executive Officer Social Support  ADL's:  Intact  Cognition: Impaired,  Mild  Sleep:  fair    Treatment Plan Summary: Medication management   Continue  Depakote 500 mg by mouth daily at bedtime Continue Risperdal 0.5 mg by mouth at bedtime   She will follow-up in 4 weeks or earlier.  Discussed with her  about outpatient commitment in detail   More than 50% of the time spent in psychoeducation, counseling and coordination of care.    This note was generated in part or whole with voice recognition software. Voice regonition is usually quite accurate but there are transcription errors that can and very often do occur. I apologize for any typographical errors that were not detected and corrected.   Rainey Pines, MD 8/28/20173:56 PM

## 2016-08-31 NOTE — Progress Notes (Signed)
   THERAPIST PROGRESS NOTE  Session Time: 40min  Participation Level: Active  Behavioral Response: Casual and NeatAlertIrritable  Type of Therapy: Individual Therapy  Treatment Goals addressed: Coping and Diagnosis: Delusional Disorder  Interventions: Motivational Interviewing and Supportive  Summary: Raven Thompson is a 70 y.o. female who presents with symptoms of her diagnosis.  Engaged Patient in discussion about her current mood and symptoms that she is current experiencing.  Facilitated a discussion on her schedule and how she was able to manage taking care of herself alone.  Discussed her progress and regression. Assisted Patient with verbally listing things that are going well.  Patient states that she does not understand why she has to attend therapy.  She reports nothing is wrong with her mind.  Patient denies admittance into any hospital this year. Shifted focus of discussion on medication management  Suicidal/Homicidal: No  Therapist Response: Provided support for Patient as she discussed her current mood. Encouraged Patient to become self aware and open with regards to her health and mental health. Stressed the importance of mood stabilization through learned coping skills and medication management.   Plan: Return again in 2 weeks.  Diagnosis: Axis I: Delusional Disorder    Axis II: No diagnosis    Lubertha South, LCSW 08/17/2016

## 2016-09-08 ENCOUNTER — Ambulatory Visit: Payer: 59 | Admitting: Psychiatry

## 2016-09-10 ENCOUNTER — Ambulatory Visit (INDEPENDENT_AMBULATORY_CARE_PROVIDER_SITE_OTHER): Payer: 59 | Admitting: Psychiatry

## 2016-09-10 ENCOUNTER — Ambulatory Visit: Payer: Self-pay | Admitting: Licensed Clinical Social Worker

## 2016-09-10 VITALS — BP 126/68 | HR 73 | Ht 62.5 in | Wt 177.6 lb

## 2016-09-10 DIAGNOSIS — F22 Delusional disorders: Secondary | ICD-10-CM

## 2016-09-10 DIAGNOSIS — F03918 Unspecified dementia, unspecified severity, with other behavioral disturbance: Secondary | ICD-10-CM

## 2016-09-10 DIAGNOSIS — F0391 Unspecified dementia with behavioral disturbance: Secondary | ICD-10-CM

## 2016-09-10 MED ORDER — RISPERIDONE 0.5 MG PO TABS: 0.5000 mg | ORAL_TABLET | Freq: Two times a day (BID) | ORAL | 1 refills | Status: DC

## 2016-09-10 MED ORDER — DIVALPROEX SODIUM 500 MG PO DR TAB: 500.0000 mg | DELAYED_RELEASE_TABLET | Freq: Every day | ORAL | 1 refills | Status: DC

## 2016-09-10 NOTE — Progress Notes (Signed)
Psychiatric MD Progress Note  Patient Identification: Raven Thompson MRN:  [id number] Date of Evaluation:  09/10/2016 Referral Source: [facility]  Chief Complaint:    Visit Diagnosis:    ICD-9-CM ICD-10-CM   1. Delusional disorder ([place]) 297.1 F22   2. Dementia with behavioral disturbance 294.21 F03.91     History of Present Illness:    Patient is a 70 year old female with history of delusional disorder presented for the follow-up. Patient was recently admitted to the medical floor after she was diagnosed with having pulmonary embolism. Patient appeared alert during the interview. She reported that she has been compliant with her medications and has noticed improvement in them. She is not having any tremors at this time. She likes taking Risperdal twice daily. She was sad after the death of her act as she put her cat to sleep 2 days ago. She reported that the cat was her for the past 10 years. Patient reported that it was difficult for her to put her to sleep as the cat was released sick. She was discussing that in detail. Patient currently denied having any suicidal ideations or plans. She has been compliant with her medications.  She reported that she is also receiving letters from the medical insurance and she has been trying to read them carefully.  Patient appeared calm and alert during the interview. She denied having any perceptual disturbances.    She is currently on outpatient commitment for medication management.     Associated Signs/Symptoms: Depression Symptoms:  fatigue, feelings of worthlessness/guilt, difficulty concentrating, impaired memory, (Hypo) Manic Symptoms:  Hallucinations, Anxiety Symptoms:  Excessive Worry, Psychotic Symptoms:  Delusions, Paranoia, PTSD Symptoms: Negative NA  Past Psychiatric History:  Recent admission to [facility]. Patient was discharged on a combination of Depakote 500 twice a day and Risperdal 1 mg twice a day. She  is currently on 180 days outpatient commitment  Previous Psychotropic Medications:  Risperdal 1 mg twice a day Depakote 500 mg twice a day Patient does not have any previous history of suicide attempts.  Substance Abuse History in the last 12 months:  No.  Consequences of Substance Abuse: Negative NA  Past Medical History:  Past Medical History:  Diagnosis Date  . CAD (coronary artery disease)   . COPD (chronic obstructive pulmonary disease) ([place])   . Hypertension   . MI (myocardial infarction) [facility])     Past Surgical History:  Procedure Laterality Date  . ABDOMINAL HYSTERECTOMY    . CORONARY ANGIOPLASTY WITH STENT PLACEMENT    . TONSILLECTOMY      Family Psychiatric History:  Patient does not have any family history of psychiatric illness.  Family History: No family history on file.  Social History:   Social History   Social History  . Marital status: Single    Spouse name: N/A  . Number of children: N/A  . Years of education: N/A   Social History Main Topics  . Smoking status: Current Some Day Smoker    Types: Cigarettes  . Smokeless tobacco: Never Used  . Alcohol use No  . Drug use: No  . Sexual activity: Not Currently    Birth control/ protection: Surgical   Other Topics Concern  . Not on file   Social History Narrative  . No narrative on file    Additional Social History:  Lives in [place] by herself. She was married 3 times in the past. He has 3 daughters and they are supportive. Has 10 grandchildren.   Allergies:  Allergies  Allergen Reactions  . Sulfa Antibiotics Nausea Only    Metabolic Disorder Labs: No results found for: HGBA1C, MPG No results found for: PROLACTIN No results found for: CHOL, TRIG, HDL, CHOLHDL, VLDL, LDLCALC   Current Medications: Current Outpatient Prescriptions  Medication Sig Dispense Refill  . divalproex (DEPAKOTE) 500 MG DR tablet Take 1 tablet (500 mg total) by mouth at bedtime. 30 tablet 0  . risperiDONE  (RISPERDAL) 0.5 MG tablet Take 1 tablet (0.5 mg total) by mouth 2 (two) times daily. 60 tablet 0  . Rivaroxaban 15 & 20 MG TBPK Take as directed on package: Start with one 15mg  tablet by mouth twice a day with food. On Day 22, switch to one 20mg  tablet once a day with food. 51 each 0   No current facility-administered medications for this visit.     Neurologic: Headache: No Seizure: No Paresthesias:No  Musculoskeletal: Strength & Muscle Tone: within normal limits Gait & Station: normal Patient leans: N/A  Psychiatric Specialty Exam: Review of Systems  Constitutional: Positive for malaise/fatigue.  Gastrointestinal: Positive for abdominal pain and diarrhea.  Musculoskeletal: Positive for neck pain.  Neurological: Positive for headaches.    Blood pressure 126/68, pulse 73, height 5' 2.5" (1.588 m), weight 177 lb 9.6 oz (80.6 kg).Body mass index is 31.97 kg/m.  General Appearance: Casual  Eye Contact:  Fair  Speech:  Clear and Coherent  Volume:  Decreased  Mood:  Anxious  Affect:  Congruent  Thought Process:  Disorganized  Orientation:  Full (Time, Place, and Person)  Thought Content:  WDL  Suicidal Thoughts:  No  Homicidal Thoughts:  No  Memory:  Immediate;   Poor  Judgement:  Fair  Insight:  Lacking  Psychomotor Activity:  Decreased  Concentration:  Concentration: Fair and Attention Span: Fair  Recall:  [employer] of Knowledge:Fair  Language: Fair  Akathisia:  No  Handed:  Right  AIMS (if indicated):    Assets:  Chief Executive Officer Social Support  ADL's:  Intact  Cognition: Impaired,  Mild  Sleep:  fair    Treatment Plan Summary: Medication management   Continue  Depakote 500 mg by mouth daily at bedtime Continue Risperdal 0.5 mg by mouth BID   She will follow-up in 4 weeks or earlier.  Discussed with her about outpatient commitment in detail   More than 50% of the time spent in psychoeducation, counseling and coordination of care.    This  note was generated in part or whole with voice recognition software. Voice regonition is usually quite accurate but there are transcription errors that can and very often do occur. I apologize for any typographical errors that were not detected and corrected.   Rainey Pines, MD 9/21/20171:09 PM

## 2016-10-13 ENCOUNTER — Encounter: Payer: Self-pay | Admitting: Psychiatry

## 2016-10-13 ENCOUNTER — Ambulatory Visit (INDEPENDENT_AMBULATORY_CARE_PROVIDER_SITE_OTHER): Payer: 59 | Admitting: Psychiatry

## 2016-10-13 ENCOUNTER — Ambulatory Visit: Payer: 59 | Admitting: Licensed Clinical Social Worker

## 2016-10-13 VITALS — BP 159/86 | HR 77 | Temp 98.4°F | Wt 178.0 lb

## 2016-10-13 DIAGNOSIS — F0391 Unspecified dementia with behavioral disturbance: Secondary | ICD-10-CM

## 2016-10-13 DIAGNOSIS — F22 Delusional disorders: Secondary | ICD-10-CM

## 2016-10-13 MED ORDER — DIVALPROEX SODIUM 500 MG PO DR TAB: 500.0000 mg | DELAYED_RELEASE_TABLET | Freq: Every day | ORAL | 1 refills | Status: DC

## 2016-10-13 MED ORDER — RISPERIDONE 0.5 MG PO TABS: 0.5000 mg | ORAL_TABLET | Freq: Two times a day (BID) | ORAL | 1 refills | Status: DC

## 2016-10-13 NOTE — Progress Notes (Signed)
Psychiatric MD Progress Note  Patient Identification: Raven Thompson MRN:  [id number] Date of Evaluation:  10/13/2016 Referral Source: [place]  Chief Complaint:   Chief Complaint    Follow-up; Medication Refill     Visit Diagnosis:    ICD-9-CM ICD-10-CM   1. Delusional disorder ([place]) 297.1 F22   2. Dementia with behavioral disturbance, unspecified dementia type 294.21 F03.91     History of Present Illness:    Patient is a 70 year old female with history of delusional disorder presented for the follow-up.  Patient appeared alert during the interview. She reported that she has been compliant with her medications and has noticed improvement in them. She is  having Mild tremors at this time. She reported that she is having gas as she drank orange juice this morning. She appeared alert during the interview and reported that she enjoys doing home yardwork. She reported that she feels anxious coming to these appointments. She reported that she has been taking her medication but would like to stop taking the medication on a regular basis. She currently denied having any suicidal ideations or plans. She reported that she was not aware of her outpatient commitment and we discussed that in detail. She reported that she is spending too much money coming for this outpatient appointment on a regular basis. We discussed about spreading her appointments and she agreed with the plan.    Patient appeared calm and alert during the interview. She denied having any perceptual disturbances.  She is currently on outpatient commitment for medication management.     Associated Signs/Symptoms: Depression Symptoms:  fatigue, feelings of worthlessness/guilt, difficulty concentrating, impaired memory, (Hypo) Manic Symptoms:  Hallucinations, Anxiety Symptoms:  Excessive Worry, Psychotic Symptoms:  Delusions, Paranoia, PTSD Symptoms: Negative NA  Past Psychiatric History:  Recent admission to  [facility]. Patient was discharged on a combination of Depakote 500 twice a day and Risperdal 1 mg twice a day. She is currently on 180 days outpatient commitment  Previous Psychotropic Medications:  Risperdal 1 mg twice a day Depakote 500 mg twice a day Patient does not have any previous history of suicide attempts.  Substance Abuse History in the last 12 months:  No.  Consequences of Substance Abuse: Negative NA  Past Medical History:  Past Medical History:  Diagnosis Date  . CAD (coronary artery disease)   . COPD (chronic obstructive pulmonary disease) ([place])   . Hypertension   . MI (myocardial infarction)     Past Surgical History:  Procedure Laterality Date  . ABDOMINAL HYSTERECTOMY    . CORONARY ANGIOPLASTY WITH STENT PLACEMENT    . TONSILLECTOMY      Family Psychiatric History:  Patient does not have any family history of psychiatric illness.  Family History: History reviewed. No pertinent family history.  Social History:   Social History   Social History  . Marital status: Single    Spouse name: N/A  . Number of children: N/A  . Years of education: N/A   Social History Main Topics  . Smoking status: Current Some Day Smoker    Types: Cigarettes  . Smokeless tobacco: Never Used  . Alcohol use No  . Drug use: No  . Sexual activity: Not Currently    Birth control/ protection: Surgical   Other Topics Concern  . None   Social History Narrative  . None    Additional Social History:  Lives in [place] by herself. She was married 3 times in the past. He has 3 daughters and  they are supportive. Has 10 grandchildren.   Allergies:   Allergies  Allergen Reactions  . Sulfa Antibiotics Nausea Only    Metabolic Disorder Labs: No results found for: HGBA1C, MPG No results found for: PROLACTIN No results found for: CHOL, TRIG, HDL, CHOLHDL, VLDL, LDLCALC   Current Medications: Current Outpatient Prescriptions  Medication Sig Dispense Refill  .  divalproex (DEPAKOTE) 500 MG DR tablet Take 1 tablet (500 mg total) by mouth at bedtime. 30 tablet 1  . risperiDONE (RISPERDAL) 0.5 MG tablet Take 1 tablet (0.5 mg total) by mouth 2 (two) times daily. 60 tablet 1  . Rivaroxaban 15 & 20 MG TBPK Take as directed on package: Start with one 15mg  tablet by mouth twice a day with food. On Day 22, switch to one 20mg  tablet once a day with food. 51 each 0  . XARELTO 20 MG TABS tablet      No current facility-administered medications for this visit.     Neurologic: Headache: No Seizure: No Paresthesias:No  Musculoskeletal: Strength & Muscle Tone: within normal limits Gait & Station: normal Patient leans: N/A  Psychiatric Specialty Exam: Review of Systems  Constitutional: Positive for malaise/fatigue.  HENT: Positive for congestion.   Musculoskeletal: Positive for neck pain.  Psychiatric/Behavioral: Positive for depression. The patient is nervous/anxious.     Blood pressure (!) 159/86, pulse 77, temperature 98.4 F (36.9 C), temperature source Oral, weight 178 lb (80.7 kg).Body mass index is 32.04 kg/m.  General Appearance: Casual  Eye Contact:  Fair  Speech:  Clear and Coherent  Volume:  Decreased  Mood:  Anxious  Affect:  Congruent  Thought Process:  Coherent  Orientation:  Full (Time, Place, and Person)  Thought Content:  WDL  Suicidal Thoughts:  No  Homicidal Thoughts:  No  Memory:  Immediate;   Poor  Judgement:  Fair  Insight:  Lacking  Psychomotor Activity:  Decreased  Concentration:  Concentration: Fair and Attention Span: Fair  Recall:  [employer] of Knowledge:Fair  Language: Fair  Akathisia:  No  Handed:  Right  AIMS (if indicated):    Assets:  Chief Executive Officer Social Support  ADL's:  Intact  Cognition: Impaired,  Mild  Sleep:  fair    Treatment Plan Summary: Medication management   Continue  Depakote 500 mg by mouth daily at bedtime Continue Risperdal 0.5 mg by mouth BID   She will follow-up  in 2 months.  Advised patient that I will be leaving this office in the end of November and she demonstrated understanding.  Discussed with her about outpatient commitment in detail   More than 50% of the time spent in psychoeducation, counseling and coordination of care.    This note was generated in part or whole with voice recognition software. Voice regonition is usually quite accurate but there are transcription errors that can and very often do occur. I apologize for any typographical errors that were not detected and corrected.   Rainey Pines, MD 10/24/20172:21 PM

## 2016-11-04 ENCOUNTER — Ambulatory Visit: Payer: Self-pay | Admitting: Psychiatry

## 2016-11-17 ENCOUNTER — Ambulatory Visit: Payer: 59 | Admitting: Psychiatry

## 2016-11-17 ENCOUNTER — Telehealth: Payer: Self-pay | Admitting: *Deleted

## 2016-11-23 ENCOUNTER — Telehealth: Payer: Self-pay | Admitting: Psychiatry

## 2016-11-27 ENCOUNTER — Other Ambulatory Visit: Payer: Self-pay | Admitting: Psychiatry

## 2016-11-27 MED ORDER — DIVALPROEX SODIUM 500 MG PO DR TAB: 500.0000 mg | DELAYED_RELEASE_TABLET | Freq: Every day | ORAL | 1 refills | Status: DC

## 2016-11-27 MED ORDER — RISPERIDONE 0.5 MG PO TABS: 0.5000 mg | ORAL_TABLET | Freq: Two times a day (BID) | ORAL | 1 refills | Status: DC

## 2016-11-27 NOTE — Telephone Encounter (Signed)
meds refilled 

## 2016-12-01 ENCOUNTER — Ambulatory Visit: Payer: 59 | Admitting: Psychiatry

## 2016-12-11 ENCOUNTER — Ambulatory Visit: Payer: 59 | Admitting: Psychiatry

## 2016-12-30 ENCOUNTER — Ambulatory Visit: Payer: 59 | Admitting: Psychiatry

## 2017-01-13 ENCOUNTER — Ambulatory Visit: Payer: 59 | Admitting: Psychiatry

## 2017-06-04 IMAGING — CT CT ANGIO CHEST-ABD-PELV FOR DISSECTION W/ AND WO/W CM
2 of 7 series · 13 of 46 positions shown, 15 images · IV contrast (APPLIED)
Comparison: Abdomen and pelvis CT from 12/27/2008.

CLINICAL DATA: Shortness of breath and chest pain beginning this
morning.

EXAM:
CT ANGIOGRAPHY CHEST, ABDOMEN AND PELVIS
TECHNIQUE: Multidetector CT imaging through the chest, abdomen and pelvis was
performed using the standard protocol during bolus administration of
intravenous contrast. Multiplanar reconstructed images and MIPs were
obtained and reviewed to evaluate the vascular anatomy.
CONTRAST:  100 cc Isovue 370

[Series 4: axial arterial · axial · arterial · 0.85mm/px · z∈[-89,+478]mm · 10 of 213 slices shown, 12 images]
[im 12/213  soft-tissue]
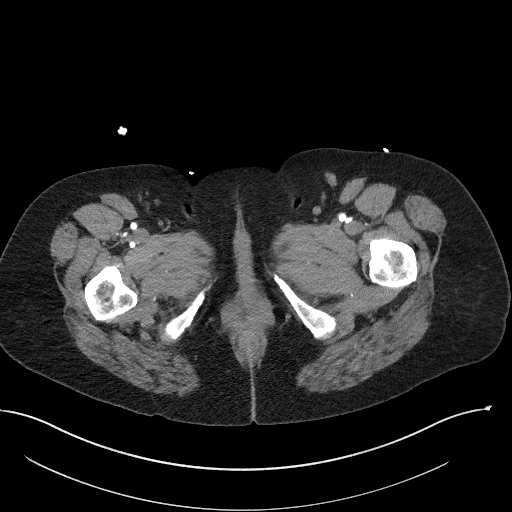
[im 12/213  bone]
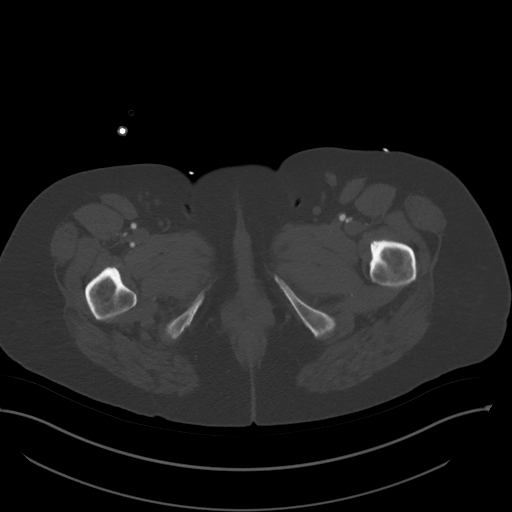
[im 34/213  soft-tissue]
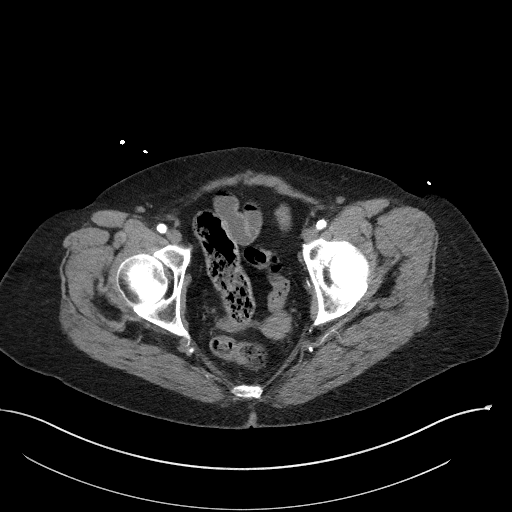
[im 56/213  soft-tissue]
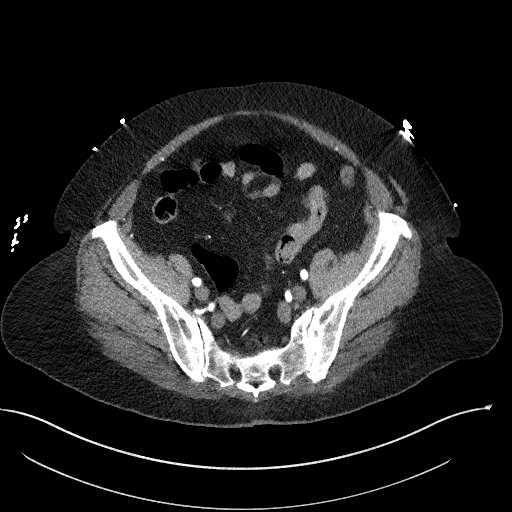
[im 79/213  soft-tissue]
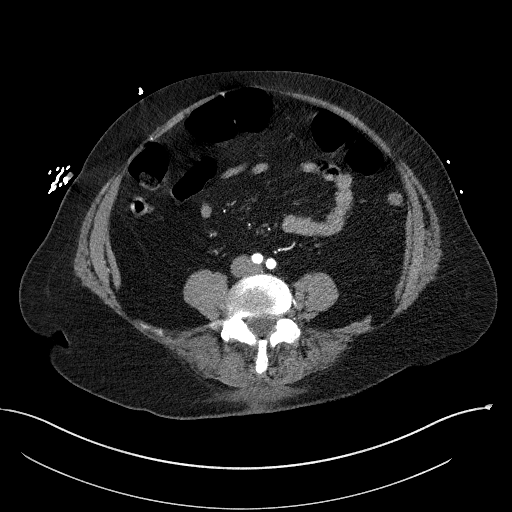
[im 101/213  soft-tissue]
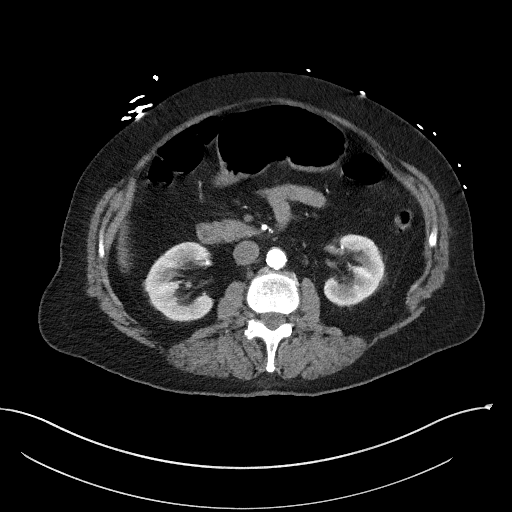
[im 112/213  soft-tissue]
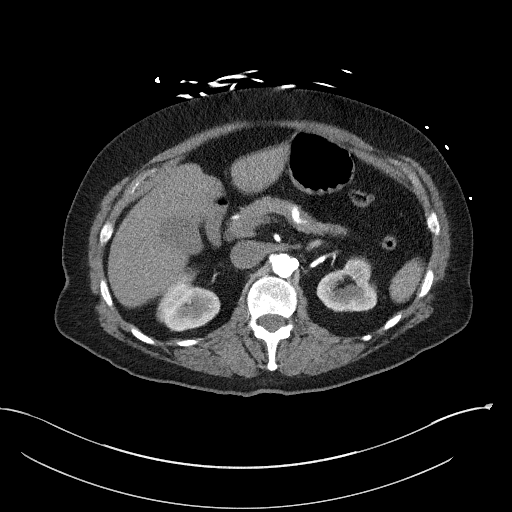
[im 134/213  soft-tissue]
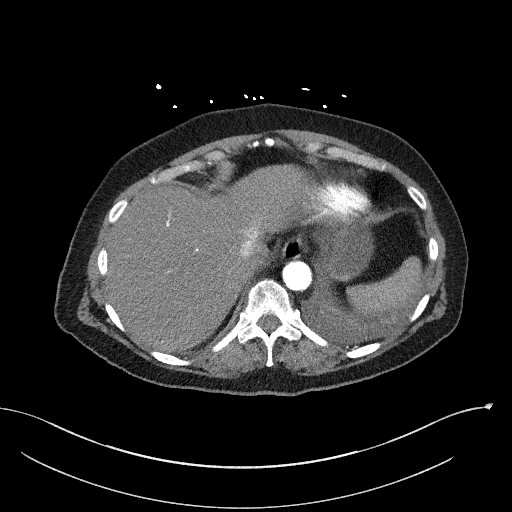
[im 157/213  soft-tissue]
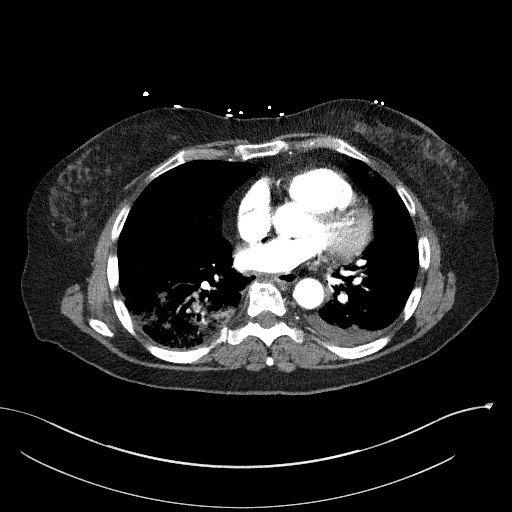
[im 179/213  soft-tissue]
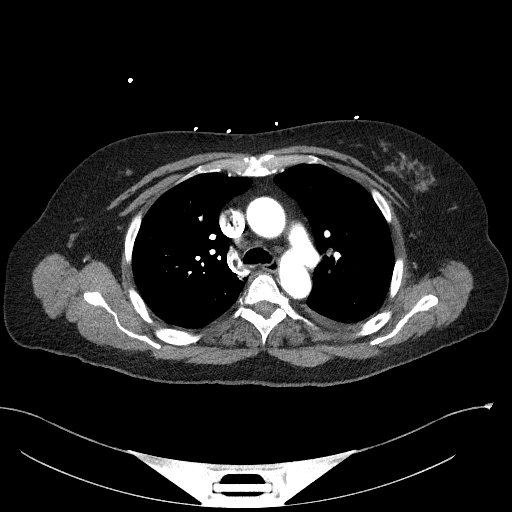
[im 179/213  bone]
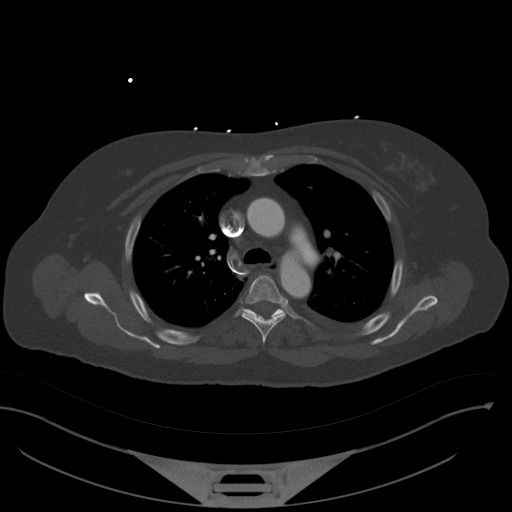
[im 201/213  soft-tissue]
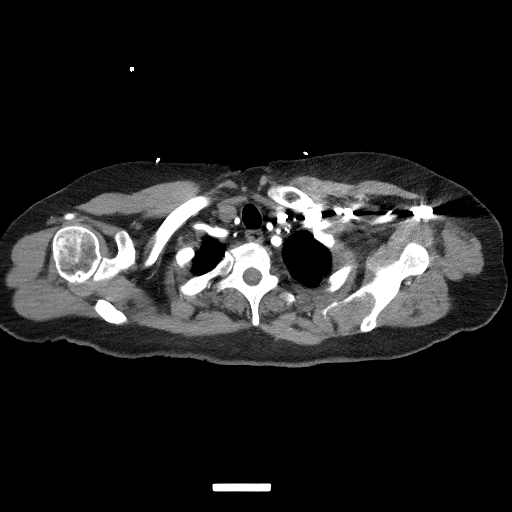

[Series 7: coronals · coronal · 0.84mm/px · 3 of 152 slices shown]
[im 38/152  soft-tissue]
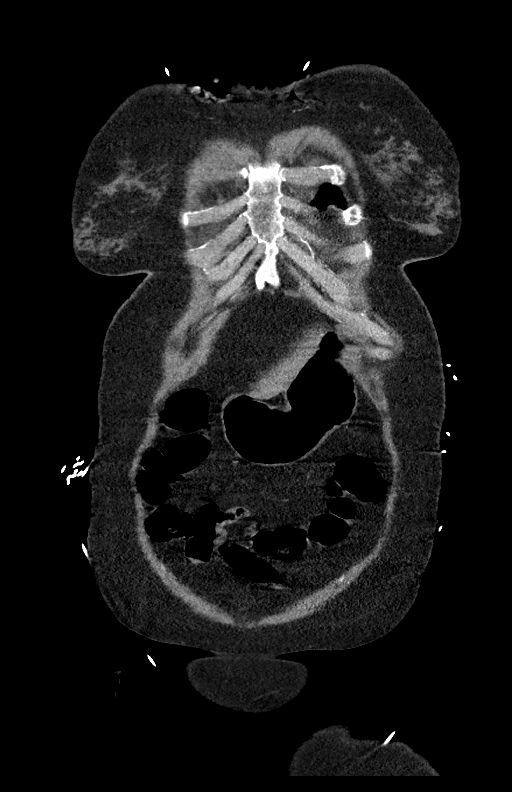
[im 76/152  soft-tissue]
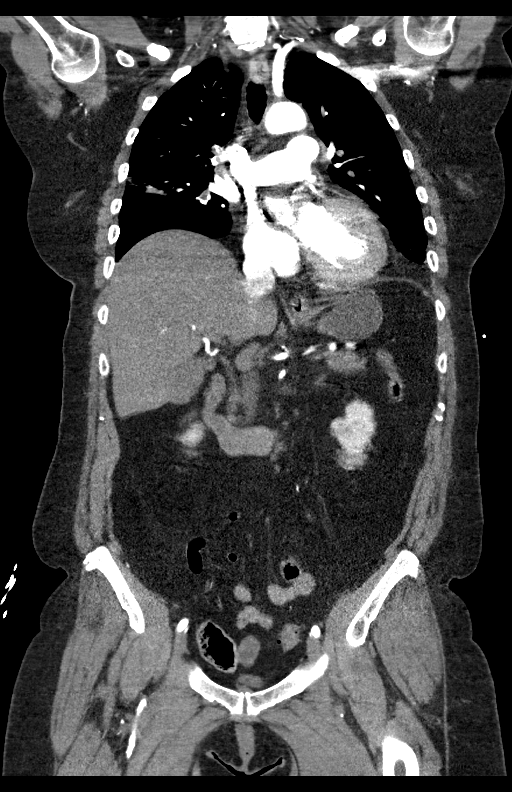
[im 114/152  soft-tissue]
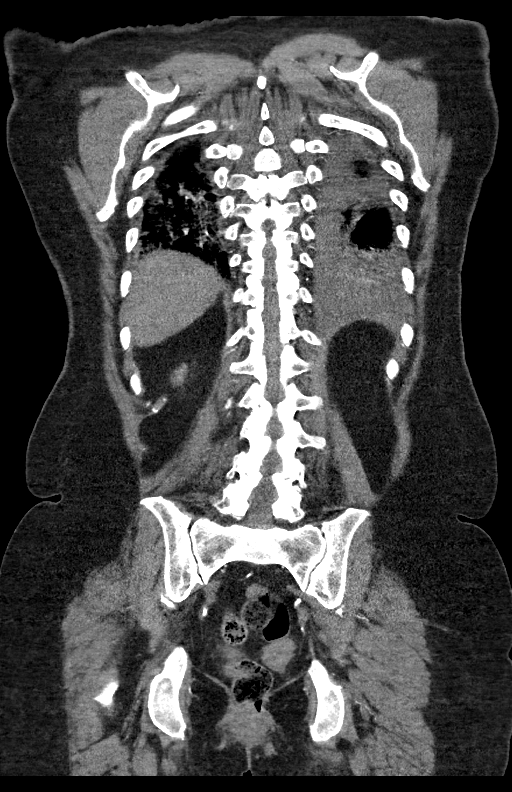

[13 of 46 positions shown; findings below may reference images not displayed]

FINDINGS: CTA CHEST FINDINGS

Cardiovascular: Heart size is upper normal. No evidence for
hyperdense crescent in the wall of the thoracic aorta to suggest
acute intramural hematoma. No evidence for thoracic aortic
dissection flap. Patient is noted to have a large volume pulmonary
embolus visible in the right main pulmonary artery extending into
lobar pulmonary arteries and all 3 lobes of the right lung. Lobar
pulmonary embolus is identified in the upper and lower lobes of the
left lung as well. The RV LV ratio is calculated at 1.7.

Mediastinum/Nodes: 9 mm short axis subcarinal lymph node is upper
normal. 9 mm short axis right hilar lymph node is upper normal. No
overt mediastinal lymphadenopathy. No evidence for hilar
lymphadenopathy. The esophagus has normal imaging features. There is
no axillary lymphadenopathy.

Lungs/Pleura: Wedge-shaped peripheral focus of airspace disease in
the right middle lobe is assisted with fairly prominent area of
alveolar ground-glass attenuation posteriorly in the right lower
lobe. There is some left base collapse/ consolidation. Small
bilateral pleural effusions noted left greater than right.

Musculoskeletal: Bone windows reveal no worrisome lytic or sclerotic
osseous lesions.

Review of the MIP images confirms the above findings.

CTA ABDOMEN AND PELVIS FINDINGS

Hepatobiliary: No focal abnormality within the liver parenchyma.
There is no evidence for gallstones, gallbladder wall thickening, or
pericholecystic fluid. No intrahepatic or extrahepatic biliary
dilation.

Pancreas: No focal mass lesion. No dilatation of the main duct. No
intraparenchymal cyst. No peripancreatic edema.

Spleen: No splenomegaly. No focal mass lesion.

Adrenals/Urinary Tract: No adrenal nodule or mass. No enhancing
abnormality identified in either kidney. No hydronephrosis. No
evidence for hydroureter. Bladder is decompressed.

Stomach/Bowel: Stomach is nondistended. No gastric wall thickening.
No evidence of outlet obstruction. Duodenum is normally positioned
as is the ligament of Treitz. No small bowel wall thickening. No
small bowel dilatation. The terminal ileum is normal. The appendix
is normal. Diverticular changes are noted in the left colon without
evidence of diverticulitis.

Vascular/Lymphatic: There is abdominal aortic atherosclerosis
without aneurysm. No dissection of the abdominal aorta. Calcific
plaque identified at the origin of the celiac axis and SMA although
no evidence for flow limiting stenosis. The IMA opacifies normally.
Calcific plaque evident at the origin of each single renal artery
without evidence for renal artery stenosis. There is no
gastrohepatic or hepatoduodenal ligament lymphadenopathy. No
intraperitoneal or retroperitoneal lymphadenopathy. No pelvic
sidewall lymphadenopathy.

Reproductive: Uterus surgically absent.  There is no adnexal mass.

Other: No intraperitoneal free fluid.

Musculoskeletal: Bone windows reveal no worrisome lytic or sclerotic
osseous lesions.

Review of the MIP images confirms the above findings.
IMPRESSION: 1. Acute bilateral pulmonary embolus with CT evidence of right heart
strain (RV/LV ratio equals 1.7) consistent with at least sub massive
(intermediate risk) pulmonary embolus. The presence of right heart
strain has been associated with an increased risk of morbidity and
mortality. Consultation with Pulmonary [REDACTED] is
recommended.
2. Right-sided pulmonary infarcts with potential associated infarct
left lower lobe.
3. Thoracoabdominal aortic atherosclerosis without evidence for
dissection or aneurysm.
I personally discussed the results by telephone with Dr. Blain at

## 2017-06-05 IMAGING — DX DG CHEST 1V PORT
1 series · 1 of 1 positions shown · non-contrast
Comparison: Chest CT yesterday.

CLINICAL DATA: Dyspnea.  The diagnosis of pulmonary embolus.

EXAM:
PORTABLE CHEST 1 VIEW

[chest ap]
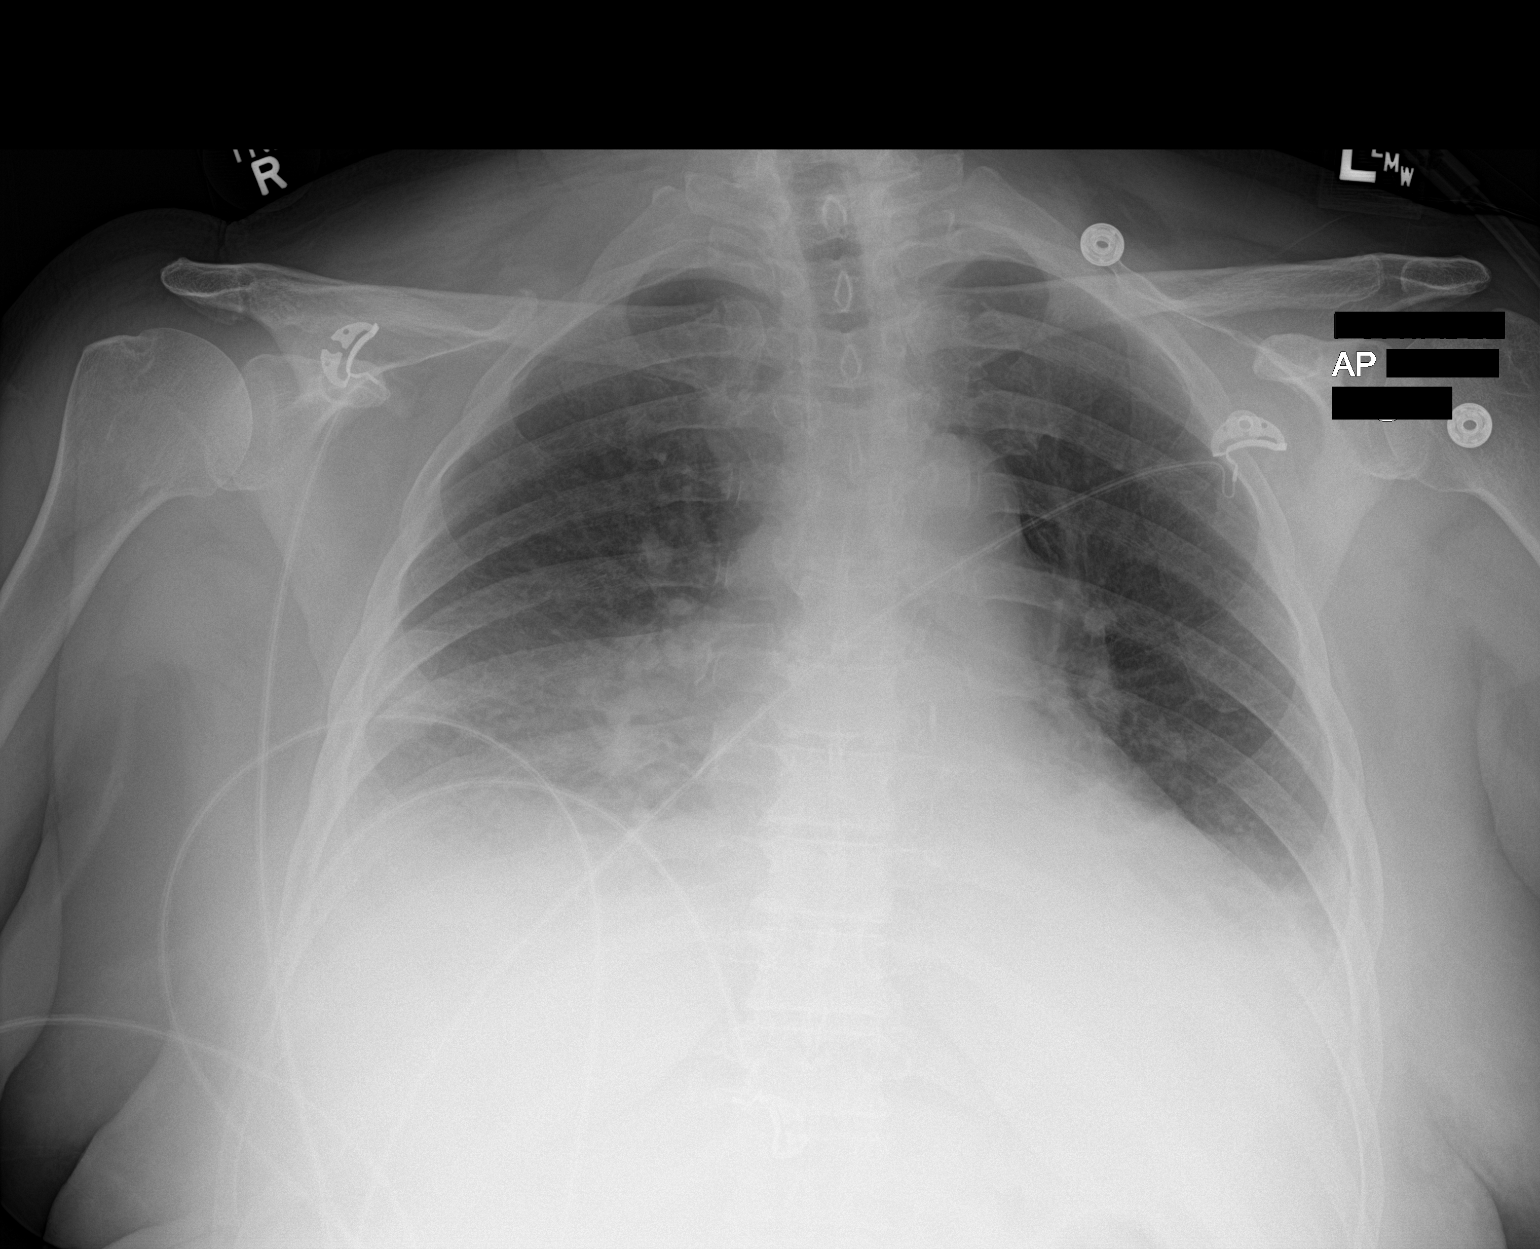

[1 of 1 positions shown; findings below may reference images not displayed]

FINDINGS: Right basilar opacity with mild increase compared to prior CT, may
be worsening airspace disease or effusion. Left basilar opacity and
pleural effusion, unchanged. The heart size and mediastinal contours
are unchanged. Upper lungs are clear. No pneumothorax.
IMPRESSION: Worsening right lung base aeration may be worsening airspace opacity
or increasing pleural effusion.

Unchanged left lung base opacity and pleural effusion.

## 2017-06-06 IMAGING — DX DG CHEST 1V
1 series · 1 of 1 positions shown · non-contrast
Comparison: August 13, 2016

CLINICAL DATA: Shortness of Breath

EXAM:
CHEST 1 VIEW

[chest ap]
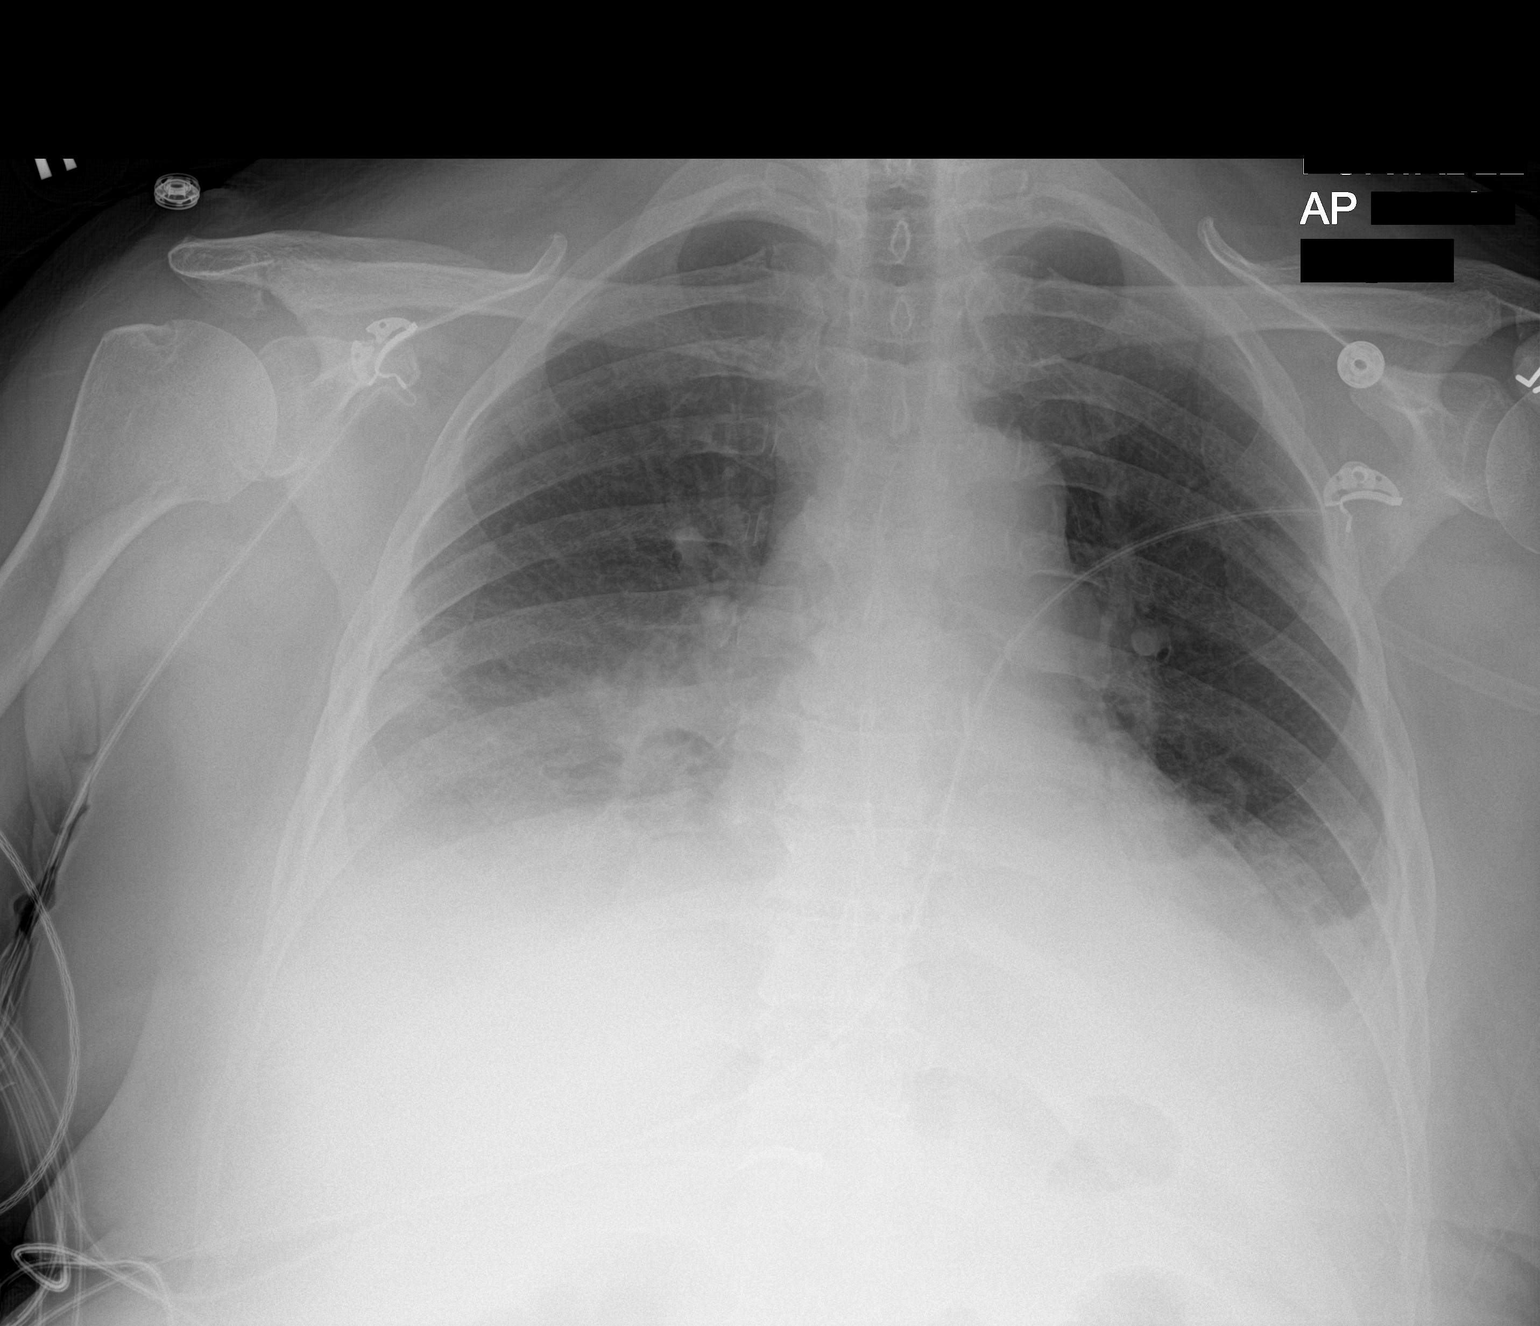

[1 of 1 positions shown; findings below may reference images not displayed]

FINDINGS: There are persistent bilateral pleural effusions with patchy
bibasilar atelectatic change. There is consolidation in the medial
left base, stable. No new opacity. Heart is upper normal in size
with pulmonary vascularity within normal limits. No adenopathy.
There is atherosclerotic calcification in the aortic arch.
IMPRESSION: Persistent medial left base consolidation with patchy bibasilar
atelectasis and bilateral pleural effusions. No new opacity. Stable
cardiac silhouette. There is aortic atherosclerosis.

## 2018-03-13 ENCOUNTER — Emergency Department
Admission: EM | Admit: 2018-03-13 | Discharge: 2018-03-15 | Disposition: A | Discharge status: Other Acute Inpatient Hospital | Payer: Medicare Other | Attending: Emergency Medicine | Admitting: Emergency Medicine

## 2018-03-13 ENCOUNTER — Encounter: Payer: Self-pay | Admitting: Emergency Medicine

## 2018-03-13 DIAGNOSIS — Z955 Presence of coronary angioplasty implant and graft: Secondary | ICD-10-CM | POA: Diagnosis not present

## 2018-03-13 DIAGNOSIS — F17210 Nicotine dependence, cigarettes, uncomplicated: Secondary | ICD-10-CM | POA: Diagnosis not present

## 2018-03-13 DIAGNOSIS — F29 Unspecified psychosis not due to a substance or known physiological condition: Secondary | ICD-10-CM | POA: Diagnosis not present

## 2018-03-13 DIAGNOSIS — J449 Chronic obstructive pulmonary disease, unspecified: Secondary | ICD-10-CM | POA: Insufficient documentation

## 2018-03-13 DIAGNOSIS — F22 Delusional disorders: Secondary | ICD-10-CM | POA: Diagnosis present

## 2018-03-13 DIAGNOSIS — I2510 Atherosclerotic heart disease of native coronary artery without angina pectoris: Secondary | ICD-10-CM | POA: Diagnosis not present

## 2018-03-13 DIAGNOSIS — Z7901 Long term (current) use of anticoagulants: Secondary | ICD-10-CM | POA: Insufficient documentation

## 2018-03-13 DIAGNOSIS — I252 Old myocardial infarction: Secondary | ICD-10-CM | POA: Insufficient documentation

## 2018-03-13 DIAGNOSIS — I10 Essential (primary) hypertension: Secondary | ICD-10-CM | POA: Insufficient documentation

## 2018-03-13 DIAGNOSIS — Z79899 Other long term (current) drug therapy: Secondary | ICD-10-CM | POA: Insufficient documentation

## 2018-03-13 LAB — CBC
HCT: 42.7 % (ref 35.0–47.0)
Hemoglobin: 14.6 g/dL (ref 12.0–16.0)
MCH: 32.6 pg (ref 26.0–34.0)
MCHC: 34.2 g/dL (ref 32.0–36.0)
MCV: 95.5 fL (ref 80.0–100.0)
PLATELETS: 153 10*3/uL (ref 150–440)
RBC: 4.48 MIL/uL (ref 3.80–5.20)
RDW: 13.7 % (ref 11.5–14.5)
WBC: 6 10*3/uL (ref 3.6–11.0)

## 2018-03-13 LAB — URINE DRUG SCREEN, QUALITATIVE (ARMC ONLY)
Amphetamines, Ur Screen: NOT DETECTED
Barbiturates, Ur Screen: NOT DETECTED
Benzodiazepine, Ur Scrn: NOT DETECTED
CANNABINOID 50 NG, UR ~~LOC~~: NOT DETECTED
Cocaine Metabolite,Ur ~~LOC~~: NOT DETECTED
MDMA (ECSTASY) UR SCREEN: NOT DETECTED
Methadone Scn, Ur: NOT DETECTED
Opiate, Ur Screen: NOT DETECTED
PHENCYCLIDINE (PCP) UR S: NOT DETECTED
TRICYCLIC, UR SCREEN: NOT DETECTED

## 2018-03-13 LAB — COMPREHENSIVE METABOLIC PANEL
ALT: 34 U/L (ref 14–54)
AST: 31 U/L (ref 15–41)
Albumin: 4.1 g/dL (ref 3.5–5.0)
Alkaline Phosphatase: 103 U/L (ref 38–126)
Anion gap: 10 (ref 5–15)
BILIRUBIN TOTAL: 1.1 mg/dL (ref 0.3–1.2)
BUN: 16 mg/dL (ref 6–20)
CHLORIDE: 107 mmol/L (ref 101–111)
CO2: 22 mmol/L (ref 22–32)
Calcium: 9.5 mg/dL (ref 8.9–10.3)
Creatinine, Ser: 0.91 mg/dL (ref 0.44–1.00)
Glucose, Bld: 107 mg/dL — ABNORMAL HIGH (ref 65–99)
POTASSIUM: 3.3 mmol/L — AB (ref 3.5–5.1)
Sodium: 139 mmol/L (ref 135–145)
TOTAL PROTEIN: 7.2 g/dL (ref 6.5–8.1)

## 2018-03-13 LAB — ACETAMINOPHEN LEVEL: Acetaminophen (Tylenol), Serum: <10 ug/mL — ABNORMAL LOW (ref 10–30)

## 2018-03-13 LAB — URINALYSIS, COMPLETE (UACMP) WITH MICROSCOPIC
Bacteria, UA: NONE SEEN
Bilirubin Urine: NEGATIVE
GLUCOSE, UA: NEGATIVE mg/dL
HGB URINE DIPSTICK: NEGATIVE
Ketones, ur: NEGATIVE mg/dL
Leukocytes, UA: NEGATIVE
Nitrite: NEGATIVE
Protein, ur: NEGATIVE mg/dL
SPECIFIC GRAVITY, URINE: 1.003 — AB (ref 1.005–1.030)
pH: 7 (ref 5.0–8.0)

## 2018-03-13 LAB — SALICYLATE LEVEL

## 2018-03-13 LAB — ETHANOL

## 2018-03-13 LAB — VALPROIC ACID LEVEL: Valproic Acid Lvl: <10 ug/mL — ABNORMAL LOW (ref 50.0–100.0)

## 2018-03-13 MED ORDER — OLANZAPINE 5 MG PO TABS: 5.0000 mg | ORAL_TABLET | Freq: Once | ORAL | Status: DC

## 2018-03-13 MED ORDER — LORAZEPAM 2 MG/ML IJ SOLN
2.0000 mg | Freq: Once | INTRAMUSCULAR | Status: AC
Start: 2018-03-13 — End: 2018-03-13
  Administered 2018-03-13: 2 mg via INTRAMUSCULAR
  Filled 2018-03-13: qty 1

## 2018-03-13 MED ORDER — OLANZAPINE 5 MG PO TABS
5.0000 mg | ORAL_TABLET | Freq: Every day | ORAL | Status: DC
  Administered 2018-03-14: 5 mg via ORAL
  Filled 2018-03-13 (×3): qty 1

## 2018-03-13 NOTE — ED Notes (Signed)
SOC called  [id number]

## 2018-03-13 NOTE — ED Provider Notes (Signed)
[facility] Emergency Department Provider Note ____________________________________________   I have reviewed the triage vital signs and the triage nursing note.  HISTORY  Chief Complaint Psychiatric Evaluation and Weakness   Historian Level 5 Caveat History Limited by altered mental status, delusions/paranoia History by phone- daughter [name]   HPI Raven Thompson is a 72 y.o. female with a paranoia/delusions episode about 2 years ago for which she was involuntarily committed by daughter and placed in psychiatric hospital until stabilization and placed on psychiatric medications.  It sounds like she did well for some time and then took herself off her medications and has for several months been not taking medical as well as psychiatric medications.  She has been declining in terms of her paranoia.  Daughter states that she thinks that someone is poisoning her, specifically the air coming out of the vents is poison.  She does not trust medical providers and thinks that the medication is poison.  Patient was really not forthcoming about this with me.  She was just very quiet about it.    Past Medical History:  Diagnosis Date  . CAD (coronary artery disease)   . COPD (chronic obstructive pulmonary disease) ([place])   . Hypertension   . MI (myocardial infarction) [facility])     Patient Active Problem List   Diagnosis Date Noted  . Pulmonary embolism ([place]) 08/12/2016  . CAD (coronary artery disease) 08/05/2016  . Essential hypertension 08/05/2016  . Mild cognitive disorder 06/25/2016  . Prolonged Q-T interval on ECG 06/23/2016  . Vitamin B12 deficiency 06/23/2016  . Congestion of nasal sinus 05/29/2016  . Tick bite 05/29/2016  . Delusional disorder ([place]) 05/28/2016  . Abnormal mammogram of right breast 12/19/2015  . Cataract 06/08/2014    Past Surgical History:  Procedure Laterality Date  . ABDOMINAL HYSTERECTOMY    . CORONARY ANGIOPLASTY WITH STENT  PLACEMENT    . TONSILLECTOMY      Prior to Admission medications   Medication Sig Start Date End Date Taking? Authorizing Provider  divalproex (DEPAKOTE) 500 MG DR tablet Take 1 tablet (500 mg total) by mouth at bedtime. 11/27/16   Rainey Pines, MD  risperiDONE (RISPERDAL) 0.5 MG tablet Take 1 tablet (0.5 mg total) by mouth 2 (two) times daily. 11/27/16   Rainey Pines, MD  Rivaroxaban 15 & 20 MG TBPK Take as directed on package: Start with one 15mg  tablet by mouth twice a day with food. On Day 22, switch to one 20mg  tablet once a day with food. 08/15/16   Hower, Aaron Mose, MD  XARELTO 20 MG TABS tablet  09/02/16   [provider]    Allergies  Allergen Reactions  . Sulfa Antibiotics Nausea Only    No family history on file.  Social History Social History   Tobacco Use  . Smoking status: Current Some Day Smoker    Types: Cigarettes  . Smokeless tobacco: Never Used  Substance Use Topics  . Alcohol use: No  . Drug use: No    Review of Systems  Limited due to patient poor historian/somewhat uncooperative She states that she is not in any pain.  States that she occasionally has diarrhea. ____________________________________________   PHYSICAL EXAM:  VITAL SIGNS: ED Triage Vitals  Enc Vitals Group     BP 03/13/18 0844 (!) 189/89     Pulse Rate 03/13/18 0844 70     Resp 03/13/18 0844 16     Temp 03/13/18 0844 98.2 F (36.8 C)  Temp Source 03/13/18 0844 Oral     SpO2 03/13/18 0844 100 %     Weight 03/13/18 0846 175 lb (79.4 kg)     Height 03/13/18 0846 5' 4.5" (1.638 m)     Head Circumference --      Peak Flow --      Pain Score --      Pain Loc --      Pain Edu? --      Excl. in [place]? --      Constitutional: Alert and oriented. Well appearing and in no distress. HEENT   Head: Normocephalic and atraumatic.      Eyes: Conjunctivae are normal. Pupils equal and round.       Ears:         Nose: No congestion/rhinnorhea.   Mouth/Throat: Mucous  membranes are moist.   Neck: No stridor. Cardiovascular/Chest: Normal rate, regular rhythm.  No murmurs, rubs, or gallops. Respiratory: Normal respiratory effort without tachypnea nor retractions. Breath sounds are clear and equal bilaterally. No wheezes/rales/rhonchi. Gastrointestinal: Soft. No distention, no guarding, no rebound. Nontender.    Genitourinary/rectal:Deferred Musculoskeletal: Nontender with normal range of motion in all extremities. No joint effusions.  No lower extremity tenderness.  No edema. Neurologic:  Normal speech and language. No gross or focal neurologic deficits are appreciated. Skin:  Skin is warm, dry and intact. No rash noted. Psychiatric: Affect.  Appears to be not forthcoming with all of her thoughts, slow to answer questions to me.  Poor insight and judgment.  No suicidal ideation.  ____________________________________________  LABS (pertinent positives/negatives) I, Lisa Roca, MD the attending physician have reviewed the labs noted below.  Labs Reviewed  COMPREHENSIVE METABOLIC PANEL - Abnormal; Notable for the following components:      Result Value   Potassium 3.3 (*)    Glucose, Bld 107 (*)    All other components within normal limits  ACETAMINOPHEN LEVEL - Abnormal; Notable for the following components:   Acetaminophen (Tylenol), Serum <10 (*)    All other components within normal limits  URINALYSIS, COMPLETE (UACMP) WITH MICROSCOPIC - Abnormal; Notable for the following components:   Color, Urine COLORLESS (*)    APPearance CLEAR (*)    Specific Gravity, Urine 1.003 (*)    Squamous Epithelial / LPF 0-5 (*)    All other components within normal limits  VALPROIC ACID LEVEL - Abnormal; Notable for the following components:   Valproic Acid Lvl <10 (*)    All other components within normal limits  ETHANOL  SALICYLATE LEVEL  CBC  URINE DRUG SCREEN, QUALITATIVE ([facility] ONLY)    ____________________________________________    EKG I,  Lisa Roca, MD, the attending physician have personally viewed and interpreted all ECGs.  See other physician note -- patient refused while on my shift  __________________________________________  PROCEDURES  Procedure(s) performed: None  Procedures  Critical Care performed: None   ____________________________________________  ED COURSE / ASSESSMENT AND PLAN  Pertinent labs & imaging results that were available during my care of the patient were reviewed by me and considered in my medical decision making (see chart for details).    I reviewed the patient's hospital records in epic, in 2017 she came in both psychotic and delusional but also with acute respiratory illness including what appears to be hypoxia and hypotension and found to have unstable PE and was given TPA followed by prescription for rivaroxaban.  From today's standpoint the patient is not having any medical complaints, certainly no chest pain or  trouble breathing or vital sign abnormalities from that standpoint.  This sounds like straightforward psychosis psychiatric exacerbation like she had previously.  I did place patient under involuntary commitment.  The rest of her medical evaluation is reassuring.  I did speak with hematology, did not recommend initiating any anticoagulation given its been a long time and there is no indicators that she is having ongoing issues with acute clots.  Specialist on call did recommend continue with IVC.   I did order EKG as baseline given starting Zyprexa.  Patient refused.  Given Ativan IM.  EKG to be got once patient is more calm and cooperative.    Patient care to be transferred at shift change to oncoming physician.    CONSULTATIONS: Specialist on-call psychiatrist, recommends continued involuntary commitment recommend psychiatric hospitalization.  Recommends Zyprexa now and at bedtime.  I have placed his home medications.  TTS consult in place for  disposition.  Spoke with Dr. Mike Gip, hematology regarding patient's prior history of being prescribed rivaroxaban for PE in 2017.  Best I can tell after speaking with her daughter the patient probably did not complete a 51-month course, however patient has not been having any cardiac or respiratory symptoms.  Given this, hematology did not recommend starting patient on any anticoagulants at this point.  Patient / Family / Caregiver informed of clinical course, medical decision-making process, and agree with plan.    ___________________________________________   FINAL CLINICAL IMPRESSION(S) / ED DIAGNOSES   Final diagnoses:  Psychosis, unspecified psychosis type ([place])      ___________________________________________        Note: This dictation was prepared with Dragon dictation. Any transcriptional errors that result from this process are unintentional    Lisa Roca, MD 03/13/18 1436

## 2018-03-13 NOTE — ED Triage Notes (Signed)
Patient presents to the ED via [facility].  Per EMS patient called 911 x 2 last night, complaining of increasing weakness over the past few days.  Patient has history of being paranoid schizophrenic and lives at home alone.  Patient was "wondering out in the yard" per EMS and was stating she was smelling a gas leak that was making her and her cat sick.  Patient is tearful during triage.  Patient is complaining of being sick and weak.  Patient states she is afraid of someone hurting her.  She asked this nurse to look at her legs and she pointed to the veins in her legs and stated, "he did this to me while I was knocked out.  I don't really know, but I have an idea."  Patient unwilling or unable to elaborate.

## 2018-03-13 NOTE — ED Notes (Signed)
SOC in progress.  

## 2018-03-13 NOTE — ED Notes (Signed)
Patient refused EKG stating, "I can't afford this."

## 2018-03-13 NOTE — ED Notes (Signed)
IVC  SOC completed patient to be inpatient

## 2018-03-13 NOTE — ED Notes (Signed)
Family at bedside. 

## 2018-03-13 NOTE — BH Assessment (Addendum)
Assessment Note  Raven Thompson is an 72 y.o. female who presents to ED with paranoid delusions. This Probation officer had a difficult time understanding pt due to her having disorganized speech and thought process. She repeatedly talked about having "all these red patches all over me". She reports having 2 supportive daughters that are aware of her current episode. During TTS assessment, pt repeatedly stated "I don't want those doctors". She denied SI/HI - which she appeared to fully understand and comprehend when she was asked this assessment question. Unable to appropriately assess pt due to paranoid delusions. Further collateral information was obtained from pt's daughter.  Diagnosis: Schizoaffective Disorder, by history  Past Medical History:  Past Medical History:  Diagnosis Date  . CAD (coronary artery disease)   . COPD (chronic obstructive pulmonary disease) ([place])   . Hypertension   . MI (myocardial infarction) [facility])     Past Surgical History:  Procedure Laterality Date  . ABDOMINAL HYSTERECTOMY    . CORONARY ANGIOPLASTY WITH STENT PLACEMENT    . TONSILLECTOMY      Family History: No family history on file.  Social History:  reports that she has been smoking cigarettes.  She has never used smokeless tobacco. She reports that she does not drink alcohol or use drugs.  Additional Social History:  Alcohol / Drug Use Pain Medications: UTA Prescriptions: UTA Over the Counter: UTA History of alcohol / drug use?: No history of alcohol / drug abuse  CIWA: CIWA-Ar BP: (!) 189/89 Pulse Rate: 70 COWS:    Allergies:  Allergies  Allergen Reactions  . Sulfa Antibiotics Nausea Only    Home Medications:  (Not in a hospital admission)  OB/GYN Status:  No LMP recorded. Patient has had a hysterectomy.  General Assessment Data Location of Assessment: [facility] ED TTS Assessment: In system Is this a Tele or Face-to-Face Assessment?: Face-to-Face Is this an Initial Assessment or a Re-assessment  for this encounter?: Initial Assessment Marital status: Widowed(2008) Is patient pregnant?: No Pregnancy Status: No Living Arrangements: Alone Can pt return to current living arrangement?: Yes Admission Status: Involuntary Is patient capable of signing voluntary admission?: No Referral Source: Self/Family/Friend Insurance type: [place] Screening Exam ([place]) Medical Exam completed: Yes  Crisis Care Plan Living Arrangements: Alone Legal Guardian: Other:(Self) Name of Psychiatrist: None Name of Therapist: None  Education Status Is patient currently in school?: No Is the patient employed, unemployed or receiving disability?: ([employer])  Risk to self with the past 6 months Suicidal Ideation: No Has patient been a risk to self within the past 6 months prior to admission? : No Suicidal Intent: No Has patient had any suicidal intent within the past 6 months prior to admission? : No Is patient at risk for suicide?: No Suicidal Plan?: No Has patient had any suicidal plan within the past 6 months prior to admission? : No Access to Means: No What has been your use of drugs/alcohol within the last 12 months?: None Previous Attempts/Gestures: No How many times?: 0 Other Self Harm Risks: None Triggers for Past Attempts: None known Intentional Self Injurious Behavior: None Family Suicide History: Unknown Recent stressful life event(s): (UTA) Persecutory voices/beliefs?: No Depression: Yes Depression Symptoms: Feeling angry/irritable Substance abuse history and/or treatment for substance abuse?: No Suicide prevention information given to non-admitted patients: Not applicable  Risk to Others within the past 6 months Homicidal Ideation: No Does patient have any lifetime risk of violence toward others beyond the six months prior to admission? : No  Thoughts of Harm to Others: No Current Homicidal Intent: No Current Homicidal Plan: No Access to Homicidal Means: No History  of harm to others?: No Assessment of Violence: None Noted Does patient have access to weapons?: No Criminal Charges Pending?: No Does patient have a court date: No Is patient on probation?: No  Psychosis Hallucinations: None noted Delusions: Persecutory, Grandiose  Mental Status Report Appearance/Hygiene: In scrubs, In hospital gown Eye Contact: Fair Motor Activity: Tremors Speech: Incoherent Level of Consciousness: Unable to assess Mood: (UTA) Affect: Unable to Assess Anxiety Level: Moderate Thought Processes: Unable to Assess Judgement: Unable to Assess Orientation: Unable to assess Obsessive Compulsive Thoughts/Behaviors: Unable to Assess  Cognitive Functioning Concentration: Unable to Assess Memory: Unable to Assess Is patient IDD: No Is patient DD?: No Insight: Unable to Assess Impulse Control: Unable to Assess Appetite: Fair Have you had any weight changes? : No Change Sleep: Unable to Assess Total Hours of Sleep: (UTA) Vegetative Symptoms: Unable to Assess  ADLScreening [facility] Assessment Services) Patient's cognitive ability adequate to safely complete daily activities?: Yes Patient able to express need for assistance with ADLs?: Yes Independently performs ADLs?: Yes (appropriate for developmental age)  Prior Inpatient Therapy Prior Inpatient Therapy: No  Prior Outpatient Therapy Prior Outpatient Therapy: No Does patient have an ACCT team?: No Does patient have Intensive In-House Services?  : No Does patient have [facility] services? : No Does patient have P4CC services?: No  ADL Screening (condition at time of admission) Patient's cognitive ability adequate to safely complete daily activities?: Yes Patient able to express need for assistance with ADLs?: Yes Independently performs ADLs?: Yes (appropriate for developmental age)       Abuse/Neglect Assessment (Assessment to be complete while patient is alone) Abuse/Neglect Assessment Can Be Completed:  Unable to assess, patient is non-responsive or altered mental status          Additional Information 1:1 In Past 12 Months?: No CIRT Risk: No Elopement Risk: No Does patient have medical clearance?: Yes  Child/Adolescent Assessment Running Away Risk: (Patient is an adult)  Disposition:  Disposition Initial Assessment Completed for this Encounter: Yes Disposition of Patient: Admit Type of inpatient treatment program: Adult Patient refused recommended treatment: Yes Mode of transportation if patient is discharged?: N/A  On Site Evaluation by:   Reviewed with Physician:    Frederich Cha 03/13/2018 6:10 PM

## 2018-03-13 NOTE — BH Assessment (Signed)
Referral information for Geri-Psychiatric Hospitalization faxed to;    Raven Thompson [phone]),    [facility] Thompson [phone]),   Raven Thompson [phone]),    Raven Thompson [phone]),    Raven Thompson [phone]   Raven Thompson [phone] or [phone]),    Raven Thompson [phone]).

## 2018-03-13 NOTE — ED Notes (Signed)
Patient's daughter took all belongings home.

## 2018-03-13 NOTE — ED Notes (Signed)
Patient's daughter at bedside.  She states that patient thinks the EDP's are members of the "cartel" and the medication is "poison."  She reports patient having a long history of not trusting medical staff.  She states that patient has been at home "freezing" because she refuses to turn on the heat due to the "poisonous gas" coming through the vents.

## 2018-03-13 NOTE — ED Notes (Signed)
TTS at bedside. 

## 2018-03-13 NOTE — ED Notes (Addendum)
Patient refusing treatment stating, "I don't have to do anything.  You can't make me."  Patient also stated to the police officer, "I will punch you in your nose."

## 2018-03-13 NOTE — ED Notes (Addendum)
Spoke with [name] (daugther) in lobby.  Advised her that patient was going inpatient for treatment.

## 2018-03-13 NOTE — ED Notes (Signed)
Patient's daughter(Becky Brett Canales [phone]) called to inquire about Patient's status and behavior; was told that Patient is currently sleeping; no changes.

## 2018-03-13 NOTE — ED Notes (Signed)
Patient states, "this feels like a negative atmosphere, I think you must be my enemies."

## 2018-03-13 NOTE — ED Notes (Signed)
Patient is speaking with TTS staff.

## 2018-03-14 ENCOUNTER — Emergency Department: Payer: Medicare Other

## 2018-03-14 DIAGNOSIS — F29 Unspecified psychosis not due to a substance or known physiological condition: Secondary | ICD-10-CM | POA: Diagnosis not present

## 2018-03-14 MED ORDER — ZIPRASIDONE MESYLATE 20 MG IM SOLR
20.0000 mg | Freq: Once | INTRAMUSCULAR | Status: AC
  Administered 2018-03-14: 20 mg via INTRAMUSCULAR
  Filled 2018-03-14: qty 20

## 2018-03-14 MED ORDER — HALOPERIDOL LACTATE 5 MG/ML IJ SOLN
5.0000 mg | Freq: Once | INTRAMUSCULAR | Status: AC
  Administered 2018-03-14: 5 mg via INTRAMUSCULAR

## 2018-03-14 MED ORDER — ACETAMINOPHEN 325 MG PO TABS
650.0000 mg | ORAL_TABLET | Freq: Once | ORAL | Status: AC
  Administered 2018-03-14: 650 mg via ORAL
  Filled 2018-03-14: qty 2

## 2018-03-14 MED ORDER — HALOPERIDOL LACTATE 5 MG/ML IJ SOLN
INTRAMUSCULAR | Status: AC
  Administered 2018-03-14: 5 mg via INTRAMUSCULAR
  Filled 2018-03-14: qty 1

## 2018-03-14 MED ORDER — ZIPRASIDONE MESYLATE 20 MG IM SOLR
20.0000 mg | Freq: Once | INTRAMUSCULAR | Status: DC
  Filled 2018-03-14: qty 20

## 2018-03-14 NOTE — ED Notes (Signed)
Pt sleeping - attempted to get VS - pt pulled away and verbalized  "No"

## 2018-03-14 NOTE — ED Notes (Signed)
ED Is the patient under IVC or is there intent for IVC: Yes.   Is the patient medically cleared: Yes.   Is there vacancy in the ED BHU: Yes.   Is the population mix appropriate for patient: Yes.   Is the patient awaiting placement in inpatient or outpatient setting: Yes.   Has the patient had a psychiatric consult: Yes.  Referral for inpt placement   Survey of unit performed for contraband, proper placement and condition of furniture, tampering with fixtures in bathroom, shower, and each patient room: Yes.  ; Findings:  APPEARANCE/BEHAVIOR Refusing treatment - calm until spoken to [place] Orientation: oriented to self place   Denies pain Hallucinations: No.None noted (Hallucinations) denies Speech: Normal - loud Gait: normal RESPIRATORY ASSESSMENT Even  Unlabored respirations  CARDIOVASCULAR ASSESSMENT Pulses equal   regular rate  Skin warm and dry   GASTROINTESTINAL ASSESSMENT no GI complaint EXTREMITIES Full ROM  PLAN OF CARE Provide calm/safe environment. Vital signs assessed twice daily. ED BHU Assessment once each 12-hour shift. Collaborate with TTS when available or as condition indicates. Assure the ED provider has rounded once each shift. Provide and encourage hygiene. Provide redirection as needed. Assess for escalating behavior; address immediately and inform ED provider.  Assess family dynamic and appropriateness for visitation as needed: Yes.  ; If necessary, describe findings:  Educate the patient/family about BHU procedures/visitation: Yes.  ; If necessary, describe findings:

## 2018-03-14 NOTE — ED Notes (Signed)
Report received from Margaret RN

## 2018-03-14 NOTE — ED Provider Notes (Signed)
-----------------------------------------   8:26 AM on 03/14/2018 -----------------------------------------   Blood pressure (!) 189/89, pulse 70, temperature 98.2 F (36.8 C), temperature source Oral, resp. rate 16, height 5' 4.5" (1.638 m), weight 79.4 kg (175 lb), SpO2 100 %.  The patient had no acute events since last update.  Calm and cooperative at this time.  Disposition is pending per Psychiatry/Behavioral Medicine team recommendations.     Alfred Levins, [place], MD 03/14/18 [phone]

## 2018-03-14 NOTE — BH Assessment (Deleted)
Referral for Geri-Psychiatric Hospitalization Update:     [facility] ([phone]): denied--do not accept dementia patients, has exhibited aggression    [facility] Hoes [phone]): Left HIPPA compliant message.     [facility] [phone]): Susie--will call us back    [facility] [phone] option 2): Leshaun--re-fax to [phone], call back early afternoon; must call each day to provide pt info before re-faxing information    [facility] Snare [phone] ex.[phone]): Teresa--will call us after Drs review charts    [facility] [phone]): denied--high acuity    [facility] [phone] or [phone]): Jennifer--denied, high acuity    [facility] [phone]): Per Pamala Hurry- no bed availability.

## 2018-03-14 NOTE — ED Notes (Signed)
BEHAVIORAL HEALTH ROUNDING Patient sleeping: Yes.   Patient alert and oriented: eyes closed  Appears to be asleep Behavior appropriate: Yes.  ; If no, describe:  Nutrition and fluids offered: Yes  Toileting and hygiene offered: sleeping Sitter present: q 15 minute observations and security monitoring Law enforcement present: yes   

## 2018-03-14 NOTE — BH Assessment (Signed)
This Probation officer faxed patient med list and EKG history to [place] per Vivian's request. Patient is still under review for gero-psych placement at [facility].

## 2018-03-14 NOTE — ED Notes (Signed)
PT IVC HAS BEEN ACCEPTED TO [facility]/NO SHERIFF TRANSPORTATION TONIGHT. WILL HAVE TO CALL FOR TRANSPORT IN THE A.M.

## 2018-03-14 NOTE — ED Provider Notes (Signed)
ED ECG REPORT I, Rudene Re, the attending physician, personally viewed and interpreted this ECG.  Sinus bradycardia, rate of 56, normal intervals, normal axis, T wave inversions in inferior, anterior and lateral leads, no ST elevations or depressions.  Unchanged from prior from 2017.   Alfred Levins, [place], MD 03/14/18 [phone]

## 2018-03-14 NOTE — BH Assessment (Signed)
This Probation officer received call from patients daughter Gwenette Greet stating she is going to pursue legal guardianship of patient.

## 2018-03-14 NOTE — BH Assessment (Signed)
This Probation officer received call from patient's daughter Tenna Delaine at [phone] about patient's status. Writer informed daughter patient has been referred to gero-psych units and once she has been placed we will call her with an update.

## 2018-03-14 NOTE — BH Assessment (Signed)
This Probation officer received a call from Steele at [employer] to see if patient has a legal guardian and if patient can sign consent for treatment forms. Writer provided [place] with fax numbers [phone] and [phone] to fax consent forms. Writer informed Brooke per patient chart she doesn't have a legal guardian.

## 2018-03-14 NOTE — BH Assessment (Signed)
Referral forGeri-Psychiatric Hospitalization Update:   [facility] ([phone]): Noella--denied due to medical   [facility] Hoes [phone]): Lynn--call back at 8:30am   [facility] [phone]): Susie--denied, as hospital does not have contract w/ pt's insurance   [facility] [phone] option 2): Leshaun--re-fax to [phone], call back early afternoon; must call each day to provide pt info before re-faxing information   [facility] Snare [phone] ex.[phone]): Teresa--will call us after Drs review referrals   [facility] [phone]): pt is on waitlist   [facility] [phone] or [phone]): Jennifer--call back around 6:15am   [facility] [phone]): no answer

## 2018-03-14 NOTE — BH Assessment (Addendum)
[facility] called requesting additional information on pt. The following information is being requested to be faxed to [phone]:  EKG Chest x-ray More stable BP  This information was provided to pt's EDP (Dr. Dahlia Client) and to pt's nurse Joycelyn Schmid).

## 2018-03-14 NOTE — BH Assessment (Addendum)
This Probation officer received call from Adrian at [facility] stated patient's meds need to be reconciled and they need previous EKG's to make a comparison with the EKG taken today. Writer will fax requested information and speak with ED nurse about updating med list.

## 2018-03-14 NOTE — ED Notes (Signed)
Pt. Transferred to [facility] from ED to room 3 after screening for contraband. Report to include Situation, Background, Assessment and Recommendations from [facility]. Pt. Oriented to unit including Q15 minute rounds as well as the security cameras for their protection. Patient is alert, warm and dry in no acute distress. Patient denies SI, HI, and AVH. Pt. Encouraged to let me know if needs arise.

## 2018-03-14 NOTE — ED Notes (Signed)
BEHAVIORAL HEALTH ROUNDING Patient sleeping: No. Patient alert and oriented: yes Behavior appropriate: Yes.  ; If no, describe:  Nutrition and fluids offered: yes Toileting and hygiene offered: Yes  Sitter present: q15 minute observations and security monitoring Law enforcement present: Yes    

## 2018-03-14 NOTE — BH Assessment (Signed)
Patient has been accepted to [facility]  Patient assigned to room 426-B Accepting physician is Dr. Lenward Chancellor Call report to [phone].  Representative was [employer].   ER Staff is aware of it:  Emily,ER Sect.;  Dr. Rip Harbour, ER MD  Amy, Patient's Nurse     Patient's [place] ([name] Kinsey-[phone]) have been updated as well.

## 2018-03-14 NOTE — ED Notes (Signed)
She has ambulated to and from the BR with a steady gait
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
## 2018-03-14 NOTE — ED Notes (Signed)
PT IVC/SOC COMPLETE/PT PENDING PLACEMENT.

## 2018-03-14 NOTE — ED Notes (Signed)
Telephone call was placed to Patient's daughter(Becky Gorrell-[phone]); who stated that her mother has not taken any meds x 2 years; mother stopped taking any medicines; as, she thinks that medicnes are poison; and that doctors want to hurt her." Patient continues to refuse treatments(b/p; ekg; chest xray.

## 2018-03-14 NOTE — ED Notes (Addendum)
She has ambulated to and from the BR with a steady gait

## 2018-03-14 NOTE — BH Assessment (Signed)
Per Jerene Pitch at [employer], patient denied due to mental status and inability to sign consent to treatment form.

## 2018-03-14 NOTE — ED Notes (Signed)
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
## 2018-03-14 NOTE — BH Assessment (Signed)
Referral forGeri-Psychiatric Hospitalization Update:   [facility] ([phone]): Noella--denied due to medical   Rosana Hoes [phone]): Lynn--call back at 8:30am   [facility] [phone]): Susie--denied, as hospital does not have contract w/ pt's insurance   [facility] [phone] option 2): Leshaun--re-fax to [phone], call back early afternoon; must call each day to provide pt info before re-faxing information   [facility] Snare [phone] ex.[phone]): Teresa--will call us after Drs review referrals   [facility] [phone]): Per Jerene Pitch, "patient denied due to mental status which would prevent her from signing consent to treatment form."   [facility] [phone] or [phone]): Per Adonis Huguenin, patient EKG needed. Faxed to [phone]   [facility] [phone]): Per Pamala Hurry, no bed availability.

## 2018-03-14 NOTE — ED Notes (Signed)
Wyonia Hough, RN spoke with Dr. Cinda Quest regarding patient having hallucinations.  VO given for 5mg  haldol IM at this time.

## 2018-03-14 NOTE — ED Notes (Signed)
She has been accepted to [facility] has been called but will not be available until the am  Continue to monitor

## 2018-03-15 NOTE — ED Notes (Signed)
Hourly rounding reveals patient sleeping in room. No complaints, stable, in no acute distress. Q15 minute rounds and monitoring via Security Cameras to continue. 

## 2018-03-15 NOTE — ED Provider Notes (Signed)
-----------------------------------------   5:58 AM on 03/15/2018 -----------------------------------------   Blood pressure (!) 153/80, pulse 76, temperature 98 F (36.7 C), resp. rate 20, height 5' 4.5" (1.638 m), weight 79.4 kg (175 lb), SpO2 100 %.  The patient had no acute events since last update.  Calm and cooperative at this time.  Disposition is pending Psychiatry/Behavioral Medicine team recommendations.     Darel Hong, MD 03/15/18 [phone]

## 2018-03-15 NOTE — ED Notes (Signed)
Hourly rounding reveals patient sitting in day room. No complaints, stable, in no acute distress. Q15 minute rounds and monitoring via [employer] to continue.

## 2018-03-15 NOTE — ED Notes (Signed)
Spoke with patient's daughter Gwenette Greet [phone]) to inform her of patient's transfer to [facility].

## 2018-03-15 NOTE — ED Notes (Signed)
PLEASE CALL PT DAUGHTER AT TIME OF TRANSPORT OUT OF FACILITY TO [place].

## 2018-03-15 NOTE — ED Notes (Signed)
E signature not performed.  Patient is IVC.

## 2018-03-15 NOTE — ED Notes (Signed)
Patient discharged in route to [facility].

## 2018-03-15 NOTE — ED Notes (Signed)
EMTALA reviewed by charge RN 

## 2018-03-15 NOTE — ED Notes (Signed)
[facility] cannot take report until 0600. This RN to attempt to call report again at that time.

## 2018-03-15 NOTE — ED Notes (Addendum)
Report to Brooke Pace, RN at [facility]. Please call [phone] to update them of pt departure at time of transport.   And patient daughter.

## 2018-08-11 ENCOUNTER — Emergency Department
Admission: EM | Admit: 2018-08-11 | Discharge: 2018-08-13 | Disposition: A | Discharge status: Other Acute Inpatient Hospital | Payer: Medicare Other | Attending: Emergency Medicine | Admitting: Emergency Medicine

## 2018-08-11 ENCOUNTER — Encounter: Payer: Self-pay | Admitting: *Deleted

## 2018-08-11 ENCOUNTER — Emergency Department: Payer: Medicare Other

## 2018-08-11 ENCOUNTER — Other Ambulatory Visit: Payer: Self-pay

## 2018-08-11 DIAGNOSIS — S0101XA Laceration without foreign body of scalp, initial encounter: Secondary | ICD-10-CM | POA: Insufficient documentation

## 2018-08-11 DIAGNOSIS — F062 Psychotic disorder with delusions due to known physiological condition: Secondary | ICD-10-CM | POA: Insufficient documentation

## 2018-08-11 DIAGNOSIS — S0990XA Unspecified injury of head, initial encounter: Secondary | ICD-10-CM | POA: Diagnosis present

## 2018-08-11 DIAGNOSIS — F09 Unspecified mental disorder due to known physiological condition: Secondary | ICD-10-CM

## 2018-08-11 DIAGNOSIS — Y999 Unspecified external cause status: Secondary | ICD-10-CM | POA: Insufficient documentation

## 2018-08-11 DIAGNOSIS — J449 Chronic obstructive pulmonary disease, unspecified: Secondary | ICD-10-CM | POA: Diagnosis not present

## 2018-08-11 DIAGNOSIS — F10920 Alcohol use, unspecified with intoxication, uncomplicated: Secondary | ICD-10-CM

## 2018-08-11 DIAGNOSIS — F17210 Nicotine dependence, cigarettes, uncomplicated: Secondary | ICD-10-CM | POA: Diagnosis not present

## 2018-08-11 DIAGNOSIS — F22 Delusional disorders: Secondary | ICD-10-CM | POA: Diagnosis not present

## 2018-08-11 DIAGNOSIS — F39 Unspecified mood [affective] disorder: Secondary | ICD-10-CM | POA: Insufficient documentation

## 2018-08-11 DIAGNOSIS — F10120 Alcohol abuse with intoxication, uncomplicated: Secondary | ICD-10-CM | POA: Diagnosis not present

## 2018-08-11 DIAGNOSIS — I10 Essential (primary) hypertension: Secondary | ICD-10-CM | POA: Insufficient documentation

## 2018-08-11 DIAGNOSIS — F1010 Alcohol abuse, uncomplicated: Secondary | ICD-10-CM

## 2018-08-11 DIAGNOSIS — Z79899 Other long term (current) drug therapy: Secondary | ICD-10-CM | POA: Insufficient documentation

## 2018-08-11 DIAGNOSIS — W19XXXA Unspecified fall, initial encounter: Secondary | ICD-10-CM | POA: Insufficient documentation

## 2018-08-11 DIAGNOSIS — Y9389 Activity, other specified: Secondary | ICD-10-CM | POA: Insufficient documentation

## 2018-08-11 DIAGNOSIS — Y92018 Other place in single-family (private) house as the place of occurrence of the external cause: Secondary | ICD-10-CM | POA: Insufficient documentation

## 2018-08-11 MED ORDER — LORAZEPAM 2 MG/ML IJ SOLN
2.0000 mg | Freq: Once | INTRAMUSCULAR | Status: AC
  Administered 2018-08-11: 2 mg via INTRAMUSCULAR
  Filled 2018-08-11: qty 1

## 2018-08-11 MED ORDER — SODIUM CHLORIDE 0.9 % IV BOLUS
1000.0000 mL | Freq: Once | INTRAVENOUS | Status: AC
Start: 2018-08-11 — End: 2018-08-12
  Administered 2018-08-12: 1000 mL via INTRAVENOUS
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
## 2018-08-11 NOTE — ED Notes (Signed)
Patient refusing CT scan at this time.  Patient has h/o paranoia and refuses most medical procedures and medications, per the family.

## 2018-08-11 NOTE — ED Provider Notes (Signed)
[facility] Emergency Department Provider Note   ____________________________________________   First MD Initiated Contact with Patient 08/11/18 2311     (approximate)  I have reviewed the triage vital signs and the nursing notes.   HISTORY  Chief Complaint Fall; Laceration; and Alcohol Intoxication  Level V caveat: Limited by intoxication History provided by daughters  HPI Raven Thompson is a 72 y.o. female who presents to the ED from home with a chief complaint of intoxication and head injury.  Patient has a history of alcohol abuse who states she was chasing after her cat outside and fell, striking her head.  Does not know if she suffered LOC.  Presents with frontal scalp laceration and scattered abrasions to her extremities.  Refusing to answer further questions; just demanding to be stable and to be discharged home.  Her daughters tell me she stopped taking her Depakote and Risperdal 5 days ago.  Taking them for mood stabilization.   Past Medical History:  Diagnosis Date  . CAD (coronary artery disease)   . COPD (chronic obstructive pulmonary disease) ([place])   . Hypertension   . MI (myocardial infarction) [facility])     Patient Active Problem List   Diagnosis Date Noted  . Pulmonary embolism ([place]) 08/12/2016  . CAD (coronary artery disease) 08/05/2016  . Essential hypertension 08/05/2016  . Mild cognitive disorder 06/25/2016  . Prolonged Q-T interval on ECG 06/23/2016  . Vitamin B12 deficiency 06/23/2016  . Congestion of nasal sinus 05/29/2016  . Tick bite 05/29/2016  . Delusional disorder ([place]) 05/28/2016  . Abnormal mammogram of right breast 12/19/2015  . Cataract 06/08/2014    Past Surgical History:  Procedure Laterality Date  . ABDOMINAL HYSTERECTOMY    . CORONARY ANGIOPLASTY WITH STENT PLACEMENT    . TONSILLECTOMY      Prior to Admission medications   Medication Sig Start Date End Date Taking? Authorizing Provider  divalproex  (DEPAKOTE) 250 MG DR tablet Take 250 mg by mouth 2 (two) times daily. 06/27/18  Yes [provider]  OLANZapine (ZYPREXA) 5 MG tablet Take 5 mg by mouth at bedtime. 07/18/18  Yes [provider]  cholecalciferol (VITAMIN D) 1000 units tablet Take 1,000 Units by mouth daily.  03/29/18   [provider]  cyanocobalamin 1000 MCG tablet Take 1,000 mcg by mouth daily.  03/29/18   [provider]  divalproex (DEPAKOTE) 500 MG DR tablet Take 1 tablet (500 mg total) by mouth at bedtime. Patient not taking: Reported on 08/12/2018 11/27/16   Rainey Pines, MD  risperiDONE (RISPERDAL) 0.5 MG tablet Take 1 tablet (0.5 mg total) by mouth 2 (two) times daily. Patient not taking: Reported on 08/12/2018 11/27/16   Rainey Pines, MD  Rivaroxaban 15 & 20 MG TBPK Take as directed on package: Start with one 15mg  tablet by mouth twice a day with food. On Day 22, switch to one 20mg  tablet once a day with food. 08/15/16   Hower, Aaron Mose, MD    Allergies Sulfa antibiotics  No family history on file.  Social History Social History   Tobacco Use  . Smoking status: Current Some Day Smoker    Types: Cigarettes  . Smokeless tobacco: Never Used  Substance Use Topics  . Alcohol use: No  . Drug use: No    Review of Systems  Constitutional: No fever/chills Eyes: No visual changes. ENT: No sore throat. Cardiovascular: Denies chest pain. Respiratory: Denies shortness of breath. Gastrointestinal: No abdominal pain.  No nausea,  no vomiting.  No diarrhea.  No constipation. Genitourinary: Negative for dysuria. Musculoskeletal: Negative for back pain. Skin: Positive for scalp laceration.  Negative for rash. Neurological: Negative for headaches, focal weakness or numbness. Psychiatric:Positive for intoxication and agitation.  ____________________________________________   PHYSICAL EXAM:  VITAL SIGNS: ED Triage Vitals  Enc Vitals Group     BP 08/11/18 2057 (!) 176/101     Pulse Rate  08/11/18 2057 86     Resp 08/11/18 2057 20     Temp 08/11/18 2057 98.4 F (36.9 C)     Temp Source 08/11/18 2057 Oral     SpO2 08/11/18 2057 99 %     Weight 08/11/18 2055 165 lb (74.8 kg)     Height 08/11/18 2055 5\' 4"  (1.626 m)     Head Circumference --      Peak Flow --      Pain Score 08/11/18 2054 10     Pain Loc --      Pain Edu? --      Excl. in [place]? --     Constitutional: Alert and oriented.  Disheveled appearing and in mild acute distress. Eyes: Conjunctivae are normal. PERRL. EOMI. Very faint left periorbital contusion. Head: Approximately 5 cm curved, well-approximated frontal scalp laceration without active bleeding. Nose: No external evidence of injury. Mouth/Throat: Mucous membranes are mildly dry.  No dental malocclusion. Neck: No stridor.  No cervical spine tenderness to palpation. Cardiovascular: Normal rate, regular rhythm. Grossly normal heart sounds.  Good peripheral circulation. Respiratory: Normal respiratory effort.  No retractions. Lungs CTAB. Gastrointestinal: Soft and nontender letter deep palpation. No distention. No abdominal bruits. No CVA tenderness. Musculoskeletal: Scattered abrasions.  No lower extremity tenderness nor edema.  No joint effusions. Neurologic: Intoxicated.  Normal speech and language. No gross focal neurologic deficits are appreciated. MAEx4. Skin:  Skin is warm, dry and intact. No rash noted.  Scattered old ecchymosis on her extremities. Psychiatric: Mood and affect are intoxicated, combative, agitated. Speech and behavior are normal.  ____________________________________________   LABS (all labs ordered are listed, but only abnormal results are displayed)  Labs Reviewed  CBC WITH DIFFERENTIAL/PLATELET - Abnormal; Notable for the following components:      Result Value   RDW 15.2 (*)    Platelets 141 (*)    All other components within normal limits  COMPREHENSIVE METABOLIC PANEL - Abnormal; Notable for the following components:     Calcium 8.6 (*)    All other components within normal limits  ETHANOL - Abnormal; Notable for the following components:   Alcohol, Ethyl (B) 171 (*)    All other components within normal limits  LIPASE, BLOOD  TROPONIN I  URINALYSIS, COMPLETE (UACMP) WITH MICROSCOPIC  URINE DRUG SCREEN, QUALITATIVE ([facility] ONLY)   ____________________________________________  EKG  ED ECG REPORT I, Fabricio Endsley J, the attending physician, personally viewed and interpreted this ECG.   Date: 08/12/2018  EKG Time: 0033  Rate: 90  Rhythm: normal EKG, normal sinus rhythm  Axis: Normal  Intervals: PACs  ST&T Change: Nonspecific  ____________________________________________  RADIOLOGY  ED MD interpretation: No ICH; no acute cardiopulmonary process  Official radiology report(s): Ct Head Wo Contrast  Result Date: 08/12/2018 CLINICAL DATA:  Unsteady gait, intoxication. EXAM: CT HEAD WITHOUT CONTRAST TECHNIQUE: Contiguous axial images were obtained from the base of the skull through the vertex without intravenous contrast. COMPARISON:  CT HEAD May 27, 2016 FINDINGS: BRAIN: No intraparenchymal hemorrhage, mass effect nor midline shift. The ventricles and sulci are normal for  age. Patchy supratentorial white matter hypodensities within normal range for patient's age, though non-specific are most compatible with chronic small vessel ischemic disease. No acute large vascular territory infarcts. No abnormal extra-axial fluid collections. Basal cisterns are patent. VASCULAR: Moderate calcific atherosclerosis of the carotid siphons and vertebral artery. SKULL: No skull fracture. No significant scalp soft tissue swelling. SINUSES/ORBITS: Trace paranasal sinus mucosal thickening. Mastoid air cells are well aerated.The included ocular globes and orbital contents are non-suspicious. Status post bilateral ocular lens implants. OTHER: None. IMPRESSION: Negative non-contrast CT HEAD for age. Electronically Signed   By:  Elon Alas M.D.   On: 08/12/2018 01:36   Dg Chest Port 1 View  Result Date: 08/12/2018 CLINICAL DATA:  Fall.  ETOH. EXAM: PORTABLE CHEST 1 VIEW COMPARISON:  Radiograph 03/14/2018 FINDINGS: Unchanged heart size and mediastinal contours with mild cardiomegaly. Left lung base scarring. No new airspace disease. No pneumothorax or large pleural effusion. No acute osseous abnormalities are seen. IMPRESSION: No acute findings. Electronically Signed   By: Jeb Levering M.D.   On: 08/12/2018 01:39    ____________________________________________   PROCEDURES  Procedure(s) performed:     Marland KitchenMarland KitchenLaceration Repair Date/Time: 08/12/2018 2:58 AM Performed by: Paulette Blanch, MD Authorized by: Paulette Blanch, MD   Consent:    Consent obtained:  Verbal   Consent given by:  Patient   Risks discussed:  Pain, poor cosmetic result and poor wound healing Anesthesia (see MAR for exact dosages):    Anesthesia method:  Topical application   Topical anesthesia: PainEase. Laceration details:    Location:  Scalp   Scalp location:  Frontal   Length (cm):  5 Repair type:    Repair type:  Simple Exploration:    Hemostasis achieved with:  Direct pressure   Wound exploration: entire depth of wound probed and visualized     Contaminated: no   Treatment:    Area cleansed with:  Saline   Amount of cleaning:  Standard   Irrigation solution:  Sterile saline   Visualized foreign bodies/material removed: no   Skin repair:    Repair method:  Staples   Number of staples:  7 Approximation:    Approximation:  Loose Post-procedure details:    Dressing:  Open (no dressing)   Patient tolerance of procedure:  Tolerated well, no immediate complications    Critical Care performed: Yes, see critical care note(s)  ____________________________________________   INITIAL IMPRESSION / ASSESSMENT AND PLAN / ED COURSE  As part of my medical decision making, I reviewed the following data within the electronic  MEDICAL RECORD NUMBER History obtained from family, Nursing notes reviewed and incorporated, Labs reviewed, EKG interpreted, Old chart reviewed, Radiograph reviewed, A consult was requested and obtained from this/these consultant(s) Psychiatry and Notes from prior ED visits   71 year old female with alcohol abuse who presents with head injury.  Also with increased paranoia after self discontinuing psychiatric medications.  Differential diagnosis includes but is not limited to [place], SDH, intoxication, metabolic, infectious, psychiatric etiologies, etc.   Clinical Course as of Aug 12 644  Thu Aug 11, 2018  2357 During the course of our conversation, patient became increasingly agitated and combative.  Refusing to have thorough evaluation; even refusing just to have a CT scan.  She has now gotten up from the bed trying to leave the premises.  Her 2 daughters have been unable to calm her or convince her to stay for evaluation.  I did tell them at this point I have no  other records other than to place patient under involuntary commitment as she is a danger to herself presently given her intoxicated state with head injury.   [JS]  Fri Aug 12, 2018  0011 Daughter tells me that since patient self discontinued her psychiatric medications 5 days ago, the family has noticed increased paranoia.  Daughter feels she is unsafe to come home.  Will consult psychiatry.   [JS]  0036 IV now in place.  Patient still agitated after IM Ativan.  Will administer 1 mg IV Ativan for calming.   [JS]  4818 Patient was able to have CT scan and chest x-ray without incident.  Returns from imaging studies resting.  Will irrigate scalp laceration and repair with staples.  Banana bag ordered; will place on CIWA protocol.   [JS]  [id number] Updated patient on all test results.  She tolerated staple repair well.   [JS]  5909 No further events overnight.  Patient remains under IVC in the ED pending psychiatry evaluation and disposition.   [JS]     Clinical Course User Index [JS] Paulette Blanch, MD     ____________________________________________   FINAL CLINICAL IMPRESSION(S) / ED DIAGNOSES  Final diagnoses:  Alcoholic intoxication without complication ([place])  Alcohol abuse  Injury of head, initial encounter  Scalp laceration, initial encounter  Mood disorder [facility])     ED Discharge Orders    None       Note:  This document was prepared using Dragon voice recognition software and may include unintentional dictation errors.    Paulette Blanch, MD 08/12/18 [phone]

## 2018-08-12 ENCOUNTER — Emergency Department: Payer: Medicare Other

## 2018-08-12 ENCOUNTER — Other Ambulatory Visit: Payer: Self-pay

## 2018-08-12 DIAGNOSIS — F22 Delusional disorders: Secondary | ICD-10-CM

## 2018-08-12 DIAGNOSIS — S0990XA Unspecified injury of head, initial encounter: Secondary | ICD-10-CM

## 2018-08-12 DIAGNOSIS — F1010 Alcohol abuse, uncomplicated: Secondary | ICD-10-CM

## 2018-08-12 DIAGNOSIS — F10120 Alcohol abuse with intoxication, uncomplicated: Secondary | ICD-10-CM | POA: Diagnosis not present

## 2018-08-12 LAB — LIPASE, BLOOD: Lipase: 26 U/L (ref 11–51)

## 2018-08-12 LAB — CBC WITH DIFFERENTIAL/PLATELET
BASOS ABS: 0 10*3/uL (ref 0–0.1)
Basophils Relative: 1 %
Eosinophils Absolute: 0 10*3/uL (ref 0–0.7)
Eosinophils Relative: 0 %
HEMATOCRIT: 43.7 % (ref 35.0–47.0)
Hemoglobin: 15 g/dL (ref 12.0–16.0)
LYMPHS ABS: 1.9 10*3/uL (ref 1.0–3.6)
LYMPHS PCT: 25 %
MCH: 33.2 pg (ref 26.0–34.0)
MCHC: 34.3 g/dL (ref 32.0–36.0)
MCV: 96.7 fL (ref 80.0–100.0)
MONO ABS: 0.8 10*3/uL (ref 0.2–0.9)
Monocytes Relative: 11 %
NEUTROS ABS: 4.9 10*3/uL (ref 1.4–6.5)
Neutrophils Relative %: 63 %
Platelets: 141 10*3/uL — ABNORMAL LOW (ref 150–440)
RBC: 4.52 MIL/uL (ref 3.80–5.20)
RDW: 15.2 % — ABNORMAL HIGH (ref 11.5–14.5)
WBC: 7.7 10*3/uL (ref 3.6–11.0)

## 2018-08-12 LAB — COMPREHENSIVE METABOLIC PANEL
ALT: 22 U/L (ref 0–44)
AST: 26 U/L (ref 15–41)
Albumin: 4 g/dL (ref 3.5–5.0)
Alkaline Phosphatase: 105 U/L (ref 38–126)
Anion gap: 11 (ref 5–15)
BILIRUBIN TOTAL: 0.6 mg/dL (ref 0.3–1.2)
BUN: 17 mg/dL (ref 8–23)
CALCIUM: 8.6 mg/dL — AB (ref 8.9–10.3)
CO2: 23 mmol/L (ref 22–32)
CREATININE: 0.77 mg/dL (ref 0.44–1.00)
Chloride: 107 mmol/L (ref 98–111)
Glucose, Bld: 77 mg/dL (ref 70–99)
Potassium: 3.6 mmol/L (ref 3.5–5.1)
Sodium: 141 mmol/L (ref 135–145)
TOTAL PROTEIN: 7.3 g/dL (ref 6.5–8.1)

## 2018-08-12 LAB — TROPONIN I: Troponin I: <0.03 ng/mL (ref <0.03)

## 2018-08-12 LAB — ETHANOL: ALCOHOL ETHYL (B): 171 mg/dL — AB (ref <10)

## 2018-08-12 MED ORDER — THIAMINE HCL 100 MG/ML IJ SOLN
100.0000 mg | Freq: Every day | INTRAMUSCULAR | Status: DC
  Administered 2018-08-12: 100 mg via INTRAVENOUS
  Filled 2018-08-12: qty 2

## 2018-08-12 MED ORDER — LORAZEPAM 2 MG/ML IJ SOLN
INTRAMUSCULAR | Status: AC
  Administered 2018-08-12: 1 mg via INTRAVENOUS
  Filled 2018-08-12: qty 1

## 2018-08-12 MED ORDER — DIVALPROEX SODIUM 250 MG PO DR TAB
250.0000 mg | DELAYED_RELEASE_TABLET | Freq: Two times a day (BID) | ORAL | Status: DC
  Administered 2018-08-12: 250 mg via ORAL
  Filled 2018-08-12: qty 1

## 2018-08-12 MED ORDER — LORAZEPAM 2 MG/ML IJ SOLN
1.0000 mg | Freq: Once | INTRAMUSCULAR | Status: AC
  Administered 2018-08-12: 1 mg via INTRAVENOUS

## 2018-08-12 MED ORDER — OLANZAPINE 5 MG PO TABS
5.0000 mg | ORAL_TABLET | Freq: Every day | ORAL | Status: DC
  Administered 2018-08-12: 5 mg via ORAL
  Filled 2018-08-12: qty 1

## 2018-08-12 MED ORDER — VITAMIN B-12 1000 MCG PO TABS
1000.0000 ug | ORAL_TABLET | Freq: Every day | ORAL | Status: DC
  Administered 2018-08-12: 1000 ug via ORAL
  Filled 2018-08-12 (×3): qty 1

## 2018-08-12 MED ORDER — THIAMINE HCL 100 MG/ML IJ SOLN
Freq: Once | INTRAVENOUS | Status: AC
  Administered 2018-08-12: 02:00:00 via INTRAVENOUS
  Filled 2018-08-12: qty 1000

## 2018-08-12 MED ORDER — LORAZEPAM 2 MG/ML IJ SOLN: 0.0000 mg | Freq: Four times a day (QID) | INTRAMUSCULAR | Status: DC

## 2018-08-12 MED ORDER — ACETAMINOPHEN 325 MG PO TABS
650.0000 mg | ORAL_TABLET | Freq: Once | ORAL | Status: AC
  Administered 2018-08-12: 650 mg via ORAL
  Filled 2018-08-12: qty 2

## 2018-08-12 NOTE — BH Assessment (Signed)
This Probation officer attempted to called [facility] at [phone] x [phone] and the number isn't working.

## 2018-08-12 NOTE — BH Assessment (Addendum)
This Probation officer received call from [place], at [facility] about patients behavior in the ED today for possible acceptance. Provider her with [facility] nurse # at [phone].

## 2018-08-12 NOTE — ED Notes (Signed)
Pt. Moved from stretcher to quad bed in room #20.  Pt. Currently sleeping.

## 2018-08-12 NOTE — Consult Note (Signed)
[place] [facility] Consult   Reason for Consult: Consult for this 72 year old woman with a history of psychiatric illness who came into the hospital after an acute fall Referring Physician: Corky Downs Patient Identification: Raven Thompson MRN:  [id number] Principal Diagnosis: Delusional disorder [facility]) Diagnosis:   Patient Active Problem List   Diagnosis Date Noted  . Alcohol abuse [F10.10] 08/12/2018  . Acute head injury [S09.90XA] 08/12/2018  . Pulmonary embolism ([place]) [I26.99] 08/12/2016  . CAD (coronary artery disease) [I25.10] 08/05/2016  . Essential hypertension [I10] 08/05/2016  . Mild cognitive disorder [F09] 06/25/2016  . Prolonged Q-T interval on ECG [R94.31] 06/23/2016  . Vitamin B12 deficiency [E53.8] 06/23/2016  . Congestion of nasal sinus [R09.81] 05/29/2016  . Tick bite [id number]] 05/29/2016  . Delusional disorder ([place]) [F22] 05/28/2016  . Abnormal mammogram of right breast [R92.8] 12/19/2015  . Cataract [H26.9] 06/08/2014    Total Time spent with patient: 1 hour  Subjective:   Raven Thompson is a 72 y.o. female patient admitted with "I am sleepy".  HPI: Patient seen chart reviewed.  Reviewed psychiatric information in the chart.  Also spoke with the patient's daughter who gives much useful information because the patient is not a great historian.  Patient came into the hospital after falling at home.  Patient's story about how it happens is very vague saying that she was trying to pick up her cat and she fell down and hit her head.  When she came to the emergency room he refused to get a head CT which was obviously indicated and refused other treatment until she was sedated.  Patient is not a good historian but admits she has been off of all of her psychiatric medicines for several days and that she has been drinking "quite a bit".  She denies to me however having any alcohol for the past 3 days which is clearly untrue as she has a positive alcohol level on  admission.  Daughter reports that the patient has been off her medicine for an unknown amount of time and has been getting increasingly psychotic.  She says the mother is paranoid psychotic with complex delusions and poor self-care at home.  Medical history: Patient complained to me of constipation but then a few minutes later told me that she was having diarrhea and then could not reconcile which of the 2 problems she was having.  She has a past history of pulmonary embolism B12 deficiency and other medical conditions.  Substance abuse history: Previously it does not look like alcohol abuse had been a problem but the patient was intoxicated when she came into the hospital and says she has been drinking quite a bit.  Daughter says that she suspects this is self-medicating as she has been more anxious and not sleeping well.  Social history: Lives independently.  Has adult children that try and watch out for her.  Past Psychiatric History: Patient has had previous episodes of presentation for psychosis and has been referred to [facility] geriatric psychiatry unit at least twice in the past.  When she takes her medicine there and it sounds like things get a little bit better but she is frequently noncompliant.  No known history of suicide attempts but recurrent psychosis.  Risk to Self: Suicidal Ideation: No Suicidal Intent: No Is patient at risk for suicide?: No Suicidal Plan?: No Access to Means: No What has been your use of drugs/alcohol within the last 12 months?: Pt reports light alcohol use but presents to the ED  intoxicated  How many times?: 0 Other Self Harm Risks: n/a Triggers for Past Attempts: None known Intentional Self Injurious Behavior: None Risk to Others: Homicidal Ideation: No Thoughts of Harm to Others: No Current Homicidal Intent: No Current Homicidal Plan: No Access to Homicidal Means: No Identified Victim: none History of harm to others?: No Assessment of Violence: None  Noted Violent Behavior Description: pt denies Does patient have access to weapons?: No Criminal Charges Pending?: No Does patient have a court date: No Prior Inpatient Therapy: Prior Inpatient Therapy: Yes Prior Therapy Dates: 2017,2019 Prior Therapy Facilty/Provider(s): [facility] Reason for Treatment: Delusion disorder Prior Outpatient Therapy: Prior Outpatient Therapy: Yes Prior Therapy Dates: (unknown) Prior Therapy Facilty/Provider(s): unknown Does patient have an ACCT team?: No Does patient have Intensive In-House Services?  : No Does patient have [facility] services? : No Does patient have P4CC services?: No  Past Medical History:  Past Medical History:  Diagnosis Date  . CAD (coronary artery disease)   . COPD (chronic obstructive pulmonary disease) ([place])   . Hypertension   . MI (myocardial infarction) [facility])     Past Surgical History:  Procedure Laterality Date  . ABDOMINAL HYSTERECTOMY    . CORONARY ANGIOPLASTY WITH STENT PLACEMENT    . TONSILLECTOMY     Family History: No family history on file. Family Psychiatric  History: Had a brother with some kind of unspecified problem Social History:  Social History   Substance and Sexual Activity  Alcohol Use No     Social History   Substance and Sexual Activity  Drug Use No    Social History   Socioeconomic History  . Marital status: Single    Spouse name: Not on file  . Number of children: Not on file  . Years of education: Not on file  . Highest education level: Not on file  Occupational History  . Not on file  Social Needs  . Financial resource strain: Not on file  . Food insecurity:    Worry: Not on file    Inability: Not on file  . Transportation needs:    Medical: Not on file    Non-medical: Not on file  Tobacco Use  . Smoking status: Current Some Day Smoker    Types: Cigarettes  . Smokeless tobacco: Never Used  Substance and Sexual Activity  . Alcohol use: No  . Drug use: No   . Sexual activity: Not Currently    Birth control/protection: Surgical  Lifestyle  . Physical activity:    Days per week: Not on file    Minutes per session: Not on file  . Stress: Not on file  Relationships  . Social connections:    Talks on phone: Not on file    Gets together: Not on file    Attends religious service: Not on file    Active member of club or organization: Not on file    Attends meetings of clubs or organizations: Not on file    Relationship status: Not on file  Other Topics Concern  . Not on file  Social History Narrative  . Not on file   Additional Social History:    Allergies:   Allergies  Allergen Reactions  . Sulfa Antibiotics Nausea Only    Labs:  Results for orders placed or performed during the hospital encounter of 08/11/18 (from the past 48 hour(s))  CBC with Differential     Status: Abnormal   Collection Time: 08/12/18 12:35 AM  Result Value Ref  Range   WBC 7.7 3.6 - 11.0 K/uL   RBC 4.52 3.80 - 5.20 MIL/uL   Hemoglobin 15.0 12.0 - 16.0 g/dL   HCT 43.7 35.0 - 47.0 %   MCV 96.7 80.0 - 100.0 fL   MCH 33.2 26.0 - 34.0 pg   MCHC 34.3 32.0 - 36.0 g/dL   RDW 15.2 (H) 11.5 - 14.5 %   Platelets 141 (L) 150 - 440 K/uL   Neutrophils Relative % 63 %   Neutro Abs 4.9 1.4 - 6.5 K/uL   Lymphocytes Relative 25 %   Lymphs Abs 1.9 1.0 - 3.6 K/uL   Monocytes Relative 11 %   Monocytes Absolute 0.8 0.2 - 0.9 K/uL   Eosinophils Relative 0 %   Eosinophils Absolute 0.0 0 - 0.7 K/uL   Basophils Relative 1 %   Basophils Absolute 0.0 0 - 0.1 K/uL    Comment: Performed at [facility], [address] [place], [place] [address]  Comprehensive metabolic panel     Status: Abnormal   Collection Time: 08/12/18 12:35 AM  Result Value Ref Range   Sodium 141 135 - 145 mmol/L   Potassium 3.6 3.5 - 5.1 mmol/L   Chloride 107 98 - 111 mmol/L   CO2 23 22 - 32 mmol/L   Glucose, Bld 77 70 - 99 mg/dL   BUN 17 8 - 23 mg/dL   Creatinine, Ser 0.77 0.44 - 1.00  mg/dL   Calcium 8.6 (L) 8.9 - 10.3 mg/dL   Total Protein 7.3 6.5 - 8.1 g/dL   Albumin 4.0 3.5 - 5.0 g/dL   AST 26 15 - 41 U/L   ALT 22 0 - 44 U/L   Alkaline Phosphatase 105 38 - 126 U/L   Total Bilirubin 0.6 0.3 - 1.2 mg/dL   GFR calc non Af Amer >60 >60 mL/min   GFR calc Af Amer >60 >60 mL/min    Comment: (NOTE) The eGFR has been calculated using the CKD EPI equation. This calculation has not been validated in all clinical situations. eGFR's persistently <60 mL/min signify possible Chronic Kidney Disease.    Anion gap 11 5 - 15    Comment: Performed at [facility], [address] [place], [place] [address]  Ethanol     Status: Abnormal   Collection Time: 08/12/18 12:35 AM  Result Value Ref Range   Alcohol, Ethyl (B) 171 (H) <10 mg/dL    Comment: (NOTE) Lowest detectable limit for serum alcohol is 10 mg/dL. For medical purposes only. Performed at [facility], [address] [place], [place] [address]   Lipase, blood     Status: None   Collection Time: 08/12/18 12:35 AM  Result Value Ref Range   Lipase 26 11 - 51 U/L    Comment: Performed at [facility], [address] [place], [place] [address]  Troponin I     Status: None   Collection Time: 08/12/18 12:35 AM  Result Value Ref Range   Troponin I <0.03 <0.03 ng/mL    Comment: Performed at [facility], [address] [place], [place] [address]    Current Facility-Administered Medications  Medication Dose Route Frequency Provider Last Rate Last Dose  . acetaminophen (TYLENOL) tablet 650 mg  650 mg Oral Once Hinda Kehr, MD      . divalproex (DEPAKOTE) DR tablet 250 mg  250 mg Oral Q12H Clapacs, John T, MD      . LORazepam (ATIVAN) injection 0-4 mg  0-4  mg Intravenous Q6H Paulette Blanch, MD      . OLANZapine [facility]) tablet 5 mg  5 mg Oral QHS Clapacs, John T, MD      . thiamine (B-1) injection 100 mg  100 mg Intravenous Daily Paulette Blanch, MD   100 mg at 08/12/18 1101    Current Outpatient Medications  Medication Sig Dispense Refill  . cholecalciferol (VITAMIN D) 1000 units tablet Take 1,000 Units by mouth daily.     . cyanocobalamin 1000 MCG tablet Take 1,000 mcg by mouth daily.     . divalproex (DEPAKOTE) 250 MG DR tablet Take 250 mg by mouth 2 (two) times daily.  0  . OLANZapine (ZYPREXA) 5 MG tablet Take 5 mg by mouth at bedtime.  0  . Rivaroxaban 15 & 20 MG TBPK Take as directed on package: Start with one 48m tablet by mouth twice a day with food. On Day 22, switch to one 241mtablet once a day with food. (Patient not taking: Reported on 08/12/2018) 51 each 0    Musculoskeletal: Strength & Muscle Tone: within normal limits Gait & Station: normal Patient leans: N/A  Psychiatric Specialty Exam: Physical Exam  Nursing note and vitals reviewed. Constitutional: She appears well-developed and well-nourished.  HENT:  Head: Normocephalic and atraumatic.  Eyes: Pupils are equal, round, and reactive to light. Conjunctivae are normal.  Neck: Normal range of motion.  Cardiovascular: Normal heart sounds.  Respiratory: Effort normal.  GI: Soft.  Musculoskeletal: Normal range of motion.  Neurological: She is alert.  Skin: Skin is warm and dry.     Psychiatric: Her affect is blunt. Her speech is delayed and tangential. She is slowed. Thought content is paranoid and delusional. She expresses impulsivity. She expresses no homicidal and no suicidal ideation. She exhibits abnormal recent memory.    Review of Systems  Constitutional: Negative.   HENT: Negative.   Eyes: Negative.   Respiratory: Negative.   Cardiovascular: Negative.   Gastrointestinal: Negative.   Musculoskeletal: Negative.   Skin: Negative.   Neurological: Negative.   Psychiatric/Behavioral: Negative.     Blood pressure (!) 153/79, pulse 86, temperature 98 F (36.7 C), resp. rate 17, height '5\' 4"'  (1.626 m), weight 74.8 kg, SpO2 97 %.Body mass index is 28.32 kg/m.  General  Appearance: Disheveled  Eye Contact:  Minimal  Speech:  Slow  Volume:  Decreased  Mood:  Euthymic  Affect:  Constricted  Thought Process:  Disorganized  Orientation:  Negative  Thought Content:  Illogical, Paranoid Ideation, Rumination and Tangential  Suicidal Thoughts:  No  Homicidal Thoughts:  No  Memory:  Immediate;   Fair Recent;   Poor Remote;   Poor  Judgement:  Poor  Insight:  Lacking  Psychomotor Activity:  Tremor  Concentration:  Concentration: Poor  Recall:  Poor  Fund of Knowledge:  Poor  Language:  Poor  Akathisia:  No  Handed:  Right  AIMS (if indicated):     Assets:  Desire for Improvement Housing Social Support  ADL's:  Impaired  Cognition:  Impaired,  Mild  Sleep:        Treatment Plan Summary: Daily contact with patient to assess and evaluate symptoms and progress in treatment, Medication management and Plan Patient presents with acute intoxication and a fall with clearly poor self-care and poor judgment.  Off of her medicine with a return of psychotic symptoms.  Patient meets commitment criteria and requires hospitalization for psychosis and stability.  Labs reviewed.  Orders reviewed with  restarting psychiatric medicine.  Case reviewed with emergency room physician and TTS.  Recommend referral to geriatric psychiatry unit specifically [place] as a first option.  Patient's head CT showed no intracranial process from the fall so that seems to be under control.  Disposition: Recommend psychiatric Inpatient admission when medically cleared. Supportive therapy provided about ongoing stressors.  Alethia [place], MD 08/12/2018 6:09 PM

## 2018-08-12 NOTE — BH Assessment (Signed)
Assessment Note  Raven Thompson is an 72 y.o. female  who presents to the ED with c/o of head laceration and intoxication. Pt reports, "I fell on my head chasing my cat. I was running down the stairway to go to grab her and I turned the corner and fell on my head. It was wet out there." Pt experiencing active thought blocking and takes several seconds to process and answer this writer's questions.  Pt having mild tremors and is unsteady while lying in bed.   Pt reports that she has had prior inpatient treatment at [facility] in [place] in March of 2019 and June of 2017. Pt has an active diagnosis of Schizophrenia and a Delusional Disorder. Pt currently prescribed Risperdal and Depakote for mood stabilization. Pt's daughters report that she stopped taking her medications 5 days ago and has been drinking heavily.   Pt denies SI/HI A/V D/H AT THIS TIME.   Diagnosis: Delusion Disorder  Past Medical History:  Past Medical History:  Diagnosis Date  . CAD (coronary artery disease)   . COPD (chronic obstructive pulmonary disease) ([place])   . Hypertension   . MI (myocardial infarction) [facility])     Past Surgical History:  Procedure Laterality Date  . ABDOMINAL HYSTERECTOMY    . CORONARY ANGIOPLASTY WITH STENT PLACEMENT    . TONSILLECTOMY      Family History: No family history on file.  Social History:  reports that she has been smoking cigarettes. She has never used smokeless tobacco. She reports that she does not drink alcohol or use drugs.  Additional Social History:  Alcohol / Drug Use Pain Medications: SEE MAR Prescriptions: SEE MAR Over the Counter: SEE MAR History of alcohol / drug use?: Yes Substance #1 Name of Substance 1: Alcohol 1 - Frequency: Daily 1 - Last Use / Amount: 08/11/18  CIWA: CIWA-Ar BP: (!) 143/69 Pulse Rate: 81 Nausea and Vomiting: no nausea and no vomiting Tactile Disturbances: none Tremor: no tremor Auditory Disturbances: not present Paroxysmal Sweats:  no sweat visible Visual Disturbances: not present Anxiety: no anxiety, at ease Headache, Fullness in Head: mild Agitation: normal activity Orientation and Clouding of Sensorium: oriented and can do serial additions CIWA-Ar Total: 2 COWS:    Allergies:  Allergies  Allergen Reactions  . Sulfa Antibiotics Nausea Only    Home Medications:  (Not in a hospital admission)  OB/GYN Status:  No LMP recorded. Patient has had a hysterectomy.  General Assessment Data Location of Assessment: [facility] ED TTS Assessment: In system Is this a Tele or Face-to-Face Assessment?: Face-to-Face Is this an Initial Assessment or a Re-assessment for this encounter?: Initial Assessment Marital status: Married [place] name: Meloen Is patient pregnant?: No Pregnancy Status: No Living Arrangements: Spouse/significant other(Pt reports living in home with her husband) Can pt return to current living arrangement?: Yes Admission Status: Involuntary Is patient capable of signing voluntary admission?: No Referral Source: Self/Family/Friend Insurance type: [employer] Screening Exam ([place]) Medical Exam completed: Yes  Crisis Care Plan Living Arrangements: Spouse/significant other(Pt reports living in home with her husband) Legal Guardian: Other: Name of Psychiatrist: DENIES Name of Therapist: DENIES  Education Status Is patient currently in school?: No Is the patient employed, unemployed or receiving disability?: Receiving disability income  Risk to self with the past 6 months Suicidal Ideation: No Has patient been a risk to self within the past 6 months prior to admission? : No Suicidal Intent: No Has patient had any suicidal intent within the past  6 months prior to admission? : No Is patient at risk for suicide?: No Suicidal Plan?: No Has patient had any suicidal plan within the past 6 months prior to admission? : No Access to Means: No What has been your use of drugs/alcohol within the  last 12 months?: Pt reports light alcohol use but presents to the ED intoxicated  Previous Attempts/Gestures: No How many times?: 0 Other Self Harm Risks: n/a Triggers for Past Attempts: None known Intentional Self Injurious Behavior: None Family Suicide History: No Recent stressful life event(s): Other (Comment)(none reported) Persecutory voices/beliefs?: No Depression: No(Pt denies depression) Depression Symptoms: (Pt denies) Substance abuse history and/or treatment for substance abuse?: Yes Suicide prevention information given to non-admitted patients: Not applicable  Risk to Others within the past 6 months Homicidal Ideation: No Does patient have any lifetime risk of violence toward others beyond the six months prior to admission? : No Thoughts of Harm to Others: No Current Homicidal Intent: No Current Homicidal Plan: No Access to Homicidal Means: No Identified Victim: none History of harm to others?: No Assessment of Violence: None Noted Violent Behavior Description: pt denies Does patient have access to weapons?: No Criminal Charges Pending?: No Does patient have a court date: No Is patient on probation?: No  Psychosis Hallucinations: None noted Delusions: None noted  Mental Status Report Appearance/Hygiene: In scrubs Eye Contact: Poor Motor Activity: Unsteady, Tremors Speech: Logical/coherent Level of Consciousness: Alert Mood: Irritable Affect: Appropriate to circumstance Anxiety Level: Minimal Thought Processes: Coherent, Relevant Judgement: Impaired Orientation: Person, Place, Situation, Appropriate for developmental age Obsessive Compulsive Thoughts/Behaviors: None  Cognitive Functioning Concentration: Decreased Memory: Recent Intact, Remote Intact Is patient IDD: No Is patient DD?: No Insight: Poor Impulse Control: Poor Appetite: Good Have you had any weight changes? : No Change Sleep: Decreased Total Hours of Sleep: 4(Pt states she takes pm flu  medications to help with sleep) Vegetative Symptoms: None  ADLScreening [facility] Assessment Services) Patient's cognitive ability adequate to safely complete daily activities?: Yes Patient able to express need for assistance with ADLs?: Yes Independently performs ADLs?: Yes (appropriate for developmental age)  Prior Inpatient Therapy Prior Inpatient Therapy: Yes Prior Therapy Dates: 2017,2019 Prior Therapy Facilty/Provider(s): [facility] Reason for Treatment: Delusion disorder  Prior Outpatient Therapy Prior Outpatient Therapy: Yes Prior Therapy Dates: (unknown) Prior Therapy Facilty/Provider(s): unknown Does patient have an ACCT team?: No Does patient have Intensive In-House Services?  : No Does patient have [facility] services? : No Does patient have P4CC services?: No  ADL Screening (condition at time of admission) Patient's cognitive ability adequate to safely complete daily activities?: Yes Is the patient deaf or have difficulty hearing?: No Does the patient have difficulty seeing, even when wearing glasses/contacts?: No Does the patient have difficulty concentrating, remembering, or making decisions?: No Patient able to express need for assistance with ADLs?: Yes Does the patient have difficulty dressing or bathing?: No Independently performs ADLs?: Yes (appropriate for developmental age) Does the patient have difficulty walking or climbing stairs?: No Weakness of Legs: None Weakness of Arms/Hands: None  Home Assistive Devices/Equipment Home Assistive Devices/Equipment: None  Therapy Consults (therapy consults require a physician order) PT Evaluation Needed: No OT Evalulation Needed: No SLP Evaluation Needed: No Abuse/Neglect Assessment (Assessment to be complete while patient is alone) Abuse/Neglect Assessment Can Be Completed: Yes Physical Abuse: Denies Verbal Abuse: Denies Sexual Abuse: Denies Exploitation of patient/patient's resources:  Denies Self-Neglect: Denies Values / Beliefs Cultural Requests During Hospitalization: None Spiritual Requests During Hospitalization: None Consults Spiritual  Care Consult Needed: No Social Work Consult Needed: No Regulatory affairs officer (For Healthcare) Does Patient Have a Medical Advance Directive?: No    Additional Information 1:1 In Past 12 Months?: No CIRT Risk: No Elopement Risk: No Does patient have medical clearance?: No  Child/Adolescent Assessment Running Away Risk: (Pt is an adult)  Disposition:  Disposition Initial Assessment Completed for this Encounter: Yes Disposition of Patient: Admit Type of inpatient treatment program: Adult Patient refused recommended treatment: No Mode of transportation if patient is discharged?: Car Patient referred to: [facility])  On Site Evaluation by:   Reviewed with Physician:    Medardo Hassing D Licia Harl 08/12/2018 1:22 PM

## 2018-08-12 NOTE — ED Notes (Signed)
Pt reassured that the noise she hears is our heart monitors at the nurses station  - continue to monitor

## 2018-08-12 NOTE — ED Notes (Signed)
Pt is resting in bed. Respirations even/unlabored. Nadn. Q-15 are being done on pt.

## 2018-08-12 NOTE — BH Assessment (Signed)
Referrals sent to the following for gero psych inpatient treatment:   . [facility]   [place], [place] [address]  [phone]  . [facility]   [place], [place] [address]  [phone]  . [place]  [address] [place] [address]  [phone]  . [address] [place] [address]  [phone]  . [facility] [address] [place] [address]  [phone]

## 2018-08-12 NOTE — BH Assessment (Signed)
Per Dr. Weber Cooks pt has been recommended for inpatient treatment. Pt referred back to [facility].

## 2018-08-12 NOTE — Discharge Instructions (Signed)
1.  Staple removal in 7 to 10 days. 

## 2018-08-12 NOTE — BH Assessment (Addendum)
Patient has been accepted to [facility]  Patient assigned to [facility] Unit-5th floor  Accepting physician is Dr.Rosado.  Call report to [phone].  Representative was [facility]  ER Staff is aware of it:  Cathy: ER Secretary  Dr. Karma Greaser: ER MD  Matt: Patient's Nurse     Patient's Family/Support System [facility] [id number]) have been updated as well.   **Can arrive after 10am**  Address: [address] [place], [place] [address]

## 2018-08-12 NOTE — ED Notes (Signed)
Pt stating "I can hear the owl." When asked what she was talking about she spelled out O-W-L and repeated that she could hear it.

## 2018-08-12 NOTE — ED Notes (Signed)
Patient is resting comfortably. 

## 2018-08-12 NOTE — ED Notes (Signed)
BEHAVIORAL HEALTH ROUNDING Patient sleeping: No. Patient alert and oriented: yes Behavior appropriate: Yes.  ; If no, describe:  Nutrition and fluids offered: yes Toileting and hygiene offered: Yes  Sitter present: q15 minute observations and security monitoring Law enforcement present: Yes    

## 2018-08-12 NOTE — ED Notes (Signed)
Pt. Accepted to [facility].  Pt. Will be going tomorrow when transportation arranged.

## 2018-08-12 NOTE — ED Notes (Signed)
Pt. Currently sleeping in bed. 

## 2018-08-12 NOTE — ED Notes (Signed)
Returned a call to [employer] who reports she is this pts POA - update provided including plan of care - visiting hours and phone times  Daughter reports that her mother has had increased paranoia recently and failing to take care of herself  (bathing, eating adl's)   Daughter would appreciate a call back  [phone]

## 2018-08-12 NOTE — ED Notes (Addendum)
Belongings given to patient's daughter, Lynnea Maizes.

## 2018-08-12 NOTE — ED Notes (Signed)
Pt is undressed and placed in purple set. Pt had a white bra, crop top, underwear and black shorts. Clothing was placed in belonging bag and was locked up.

## 2018-08-13 MED ORDER — ACETAMINOPHEN 325 MG PO TABS
650.0000 mg | ORAL_TABLET | Freq: Once | ORAL | Status: AC
  Administered 2018-08-13: 650 mg via ORAL
  Filled 2018-08-13: qty 2

## 2018-08-13 NOTE — ED Notes (Addendum)
EDP does not feel BP medications are warranted at this time. Pt is asymptomatic. Elevated reading likely due to patient's anxiety and irritation.   Pt refusing to allow staff to re-check BP.

## 2018-08-13 NOTE — ED Notes (Signed)
Pt. IVC and accepted to [facility].

## 2018-08-13 NOTE — ED Notes (Signed)
Called [facility] for transport [phone]

## 2018-08-13 NOTE — ED Notes (Signed)
emtala reviewed by this RN 

## 2018-08-13 NOTE — ED Provider Notes (Addendum)
-----------------------------------------   8:47 AM on 08/13/2018 -----------------------------------------  Prior to discharge there is a marginally elevated blood pressure, patient is angry and upset about being transferred, I do not think blood pressure medications are indicated she has no chest pain no shortness of breath no headache or other signs of decompensated hypertensive urgency, most likely when she relaxes and calms down this will start to come back down.  I do not feel that that is a direct impediment to transfer.  Her symptomatic blood pressure can be monitored somewhere else as well as here, and it would not be sufficient to stop me from discharging her home if that was her destination so it certainly should not stop me from sending her to another facility   ----------------------------------------- 7:23 AM on 08/13/2018 -----------------------------------------   Blood pressure (!) 152/66, pulse (!) 58, temperature 98.1 F (36.7 C), temperature source Oral, resp. rate 17, height 5\' 4"  (1.626 m), weight 74.8 kg, SpO2 97 %.  The patient had no acute events since last update.  Calm and cooperative at this time.  Disposition is pending Psychiatry/Behavioral Medicine team recommendations.     Schuyler Amor, MD 08/13/18 1607    Schuyler Amor, MD 08/13/18 [phone]

## 2018-08-13 NOTE — ED Notes (Signed)
Pt refusing to leave with officer. Officer speaking with patient. Patient also allowed to call her daughter. Pt did leave willingly after phone call.

## 2018-08-13 NOTE — ED Notes (Signed)
Pt discharged to [facility] via [facility].  Patient's  POA made aware of transfer. VS stable (BP elevated, but cleared for discharge by EDP).  Report called.  Belongings had been sent home with patient's daughter.

## 2018-08-14 IMAGING — US US ABDOMEN LIMITED
1 series · 14 of 25 positions shown · non-contrast
Comparison: None.

CLINICAL DATA: Abdomen pain for 2 days

EXAM:
US ABDOMEN LIMITED - RIGHT UPPER QUADRANT

[Series 1: us abdomen limited · 0.23mm/px · 14 of 61 slices shown]
[im 1/61]
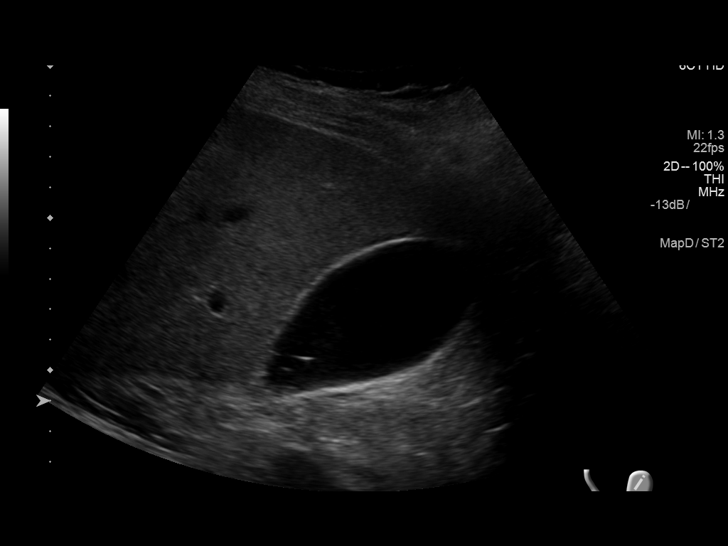
[im 6/61]
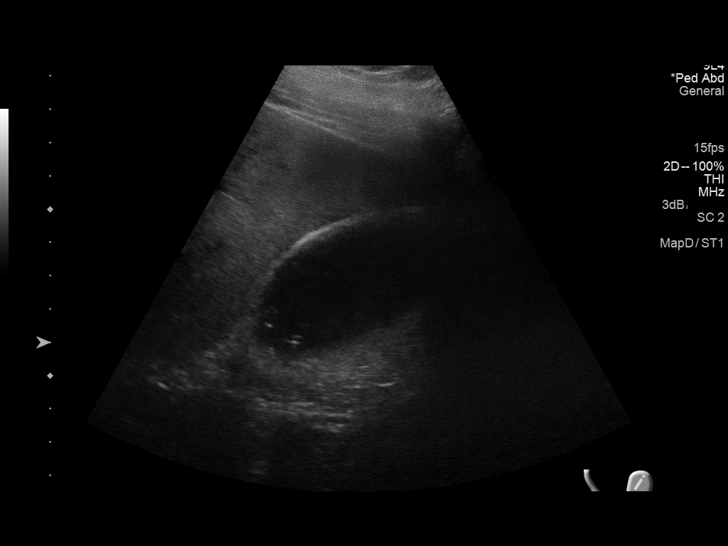
[im 11/61]
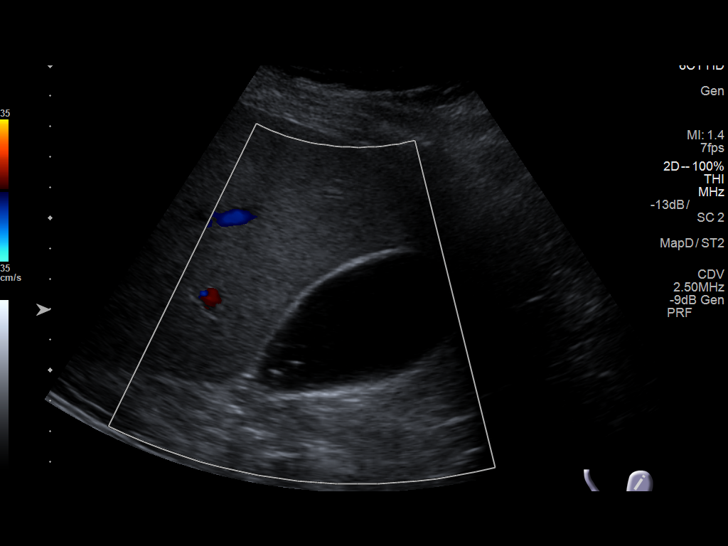
[im 16/61]
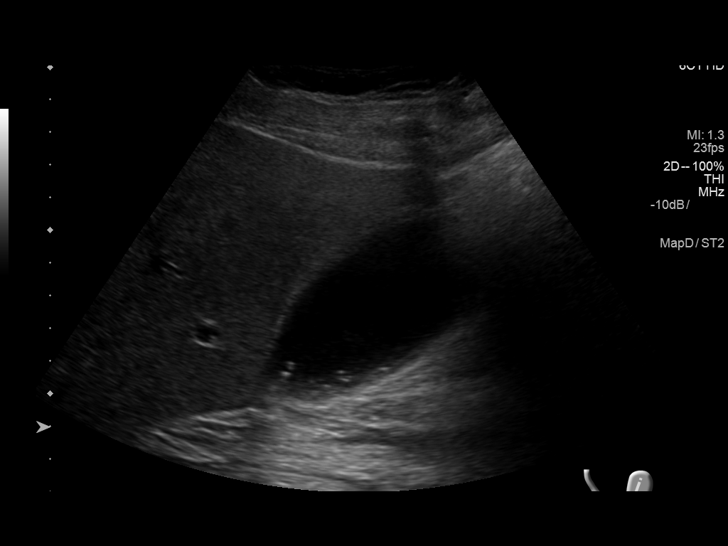
[im 21/61]
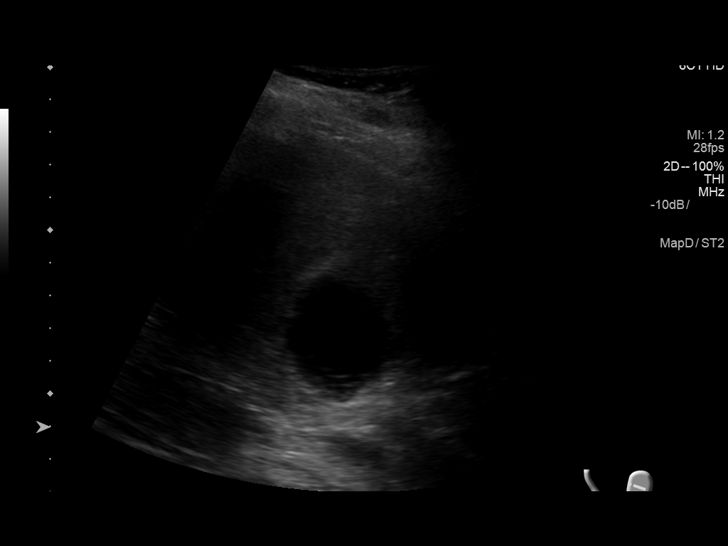
[im 23/61]
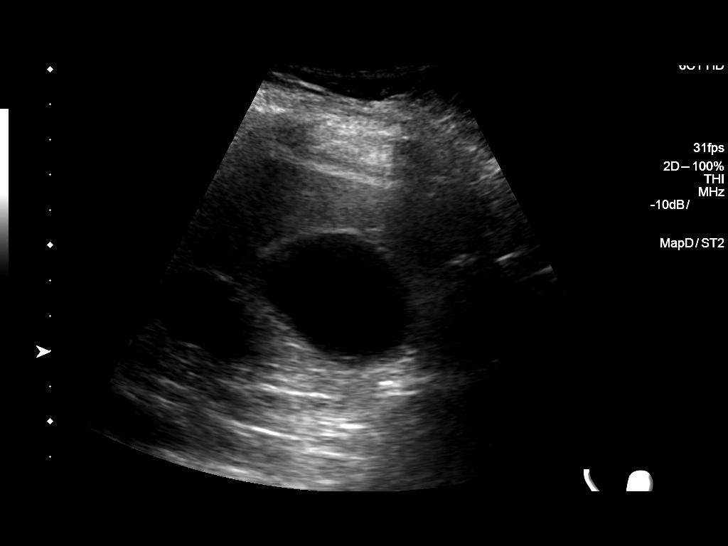
[im 28/61]
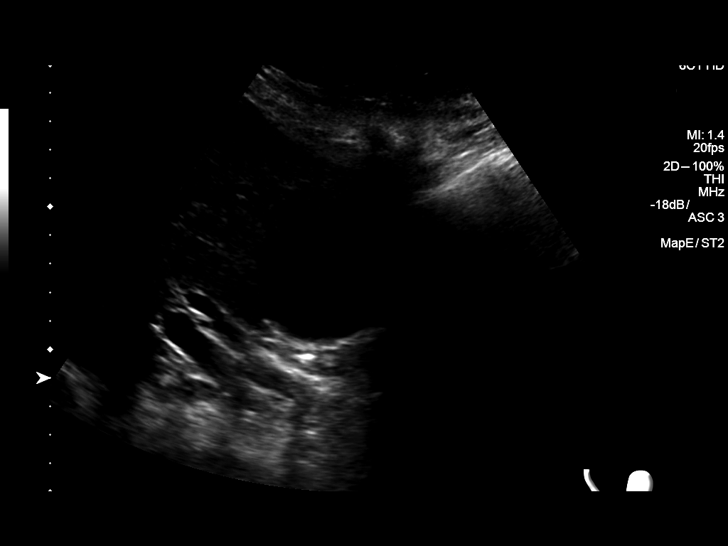
[im 33/61]
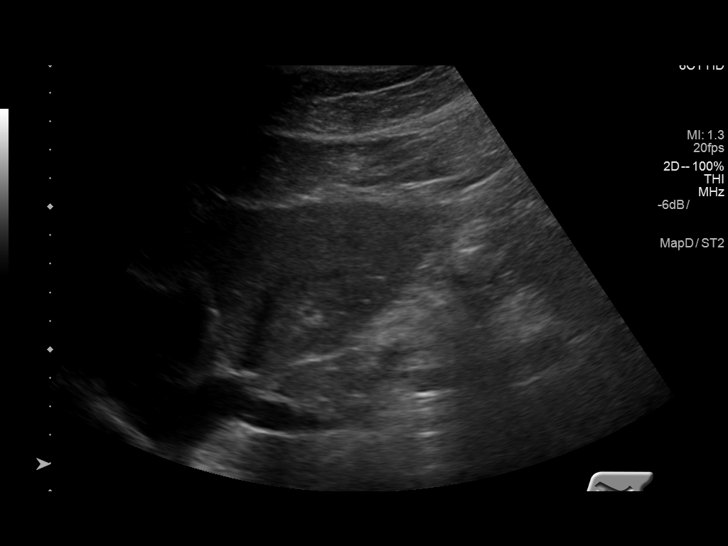
[im 38/61]
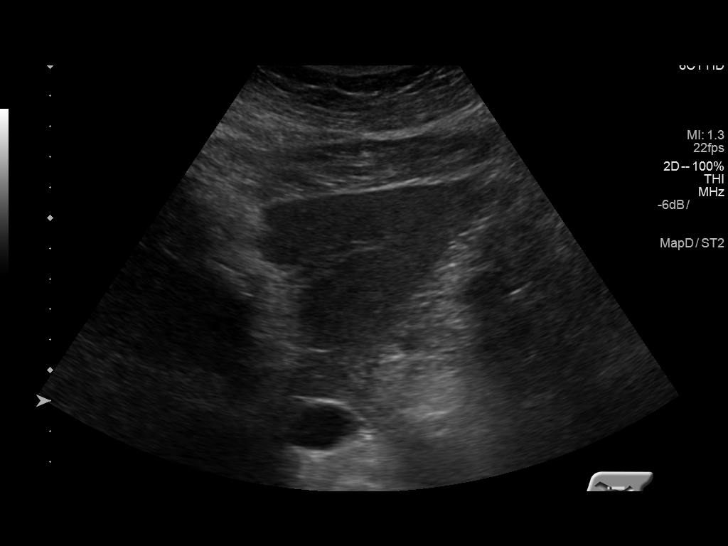
[im 41/61]
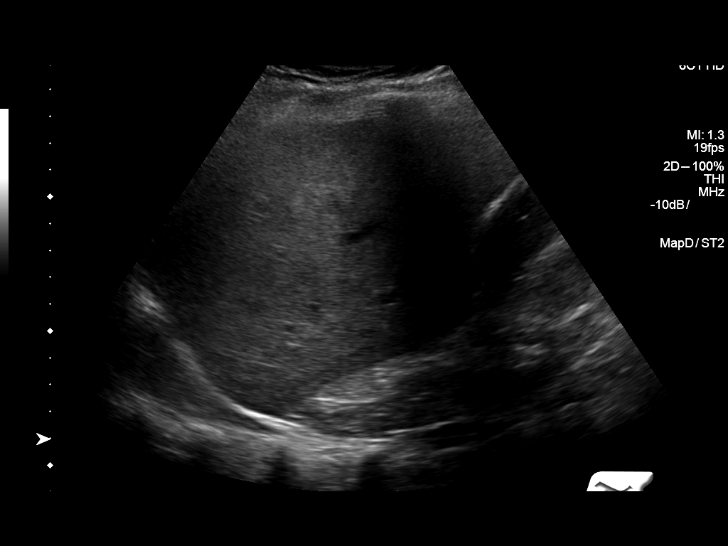
[im 46/61]
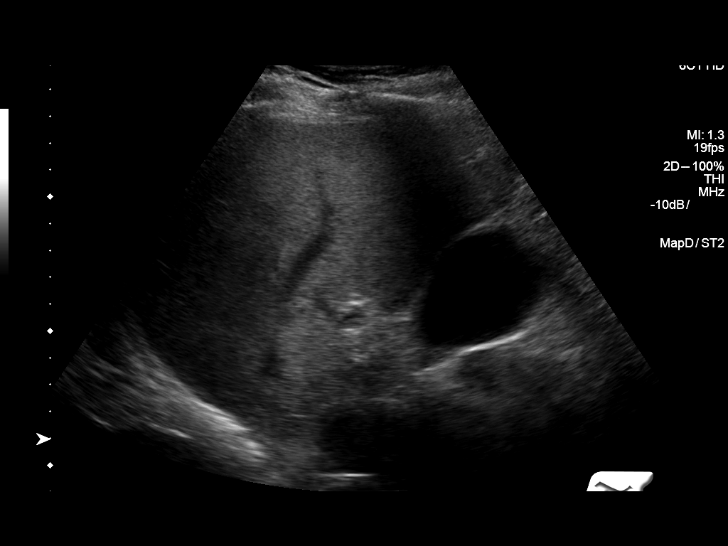
[im 51/61]
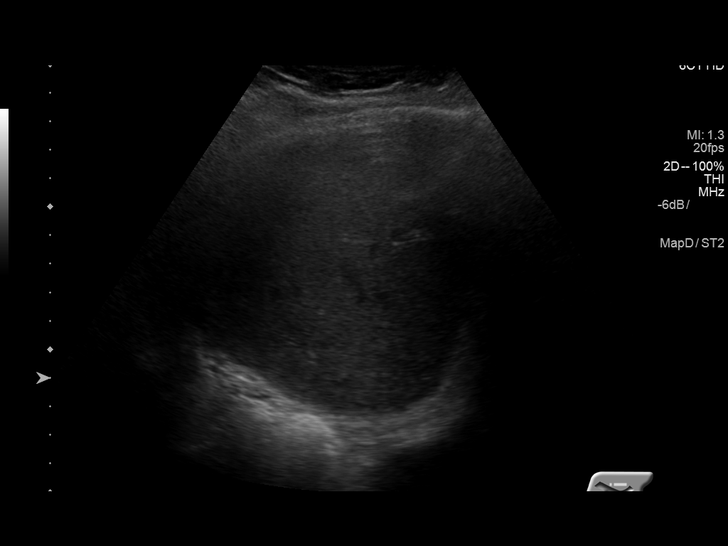
[im 56/61]
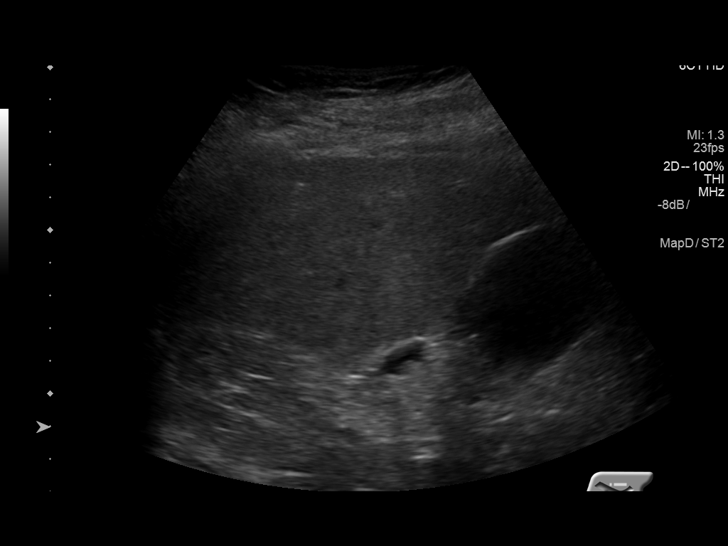
[im 61/61]
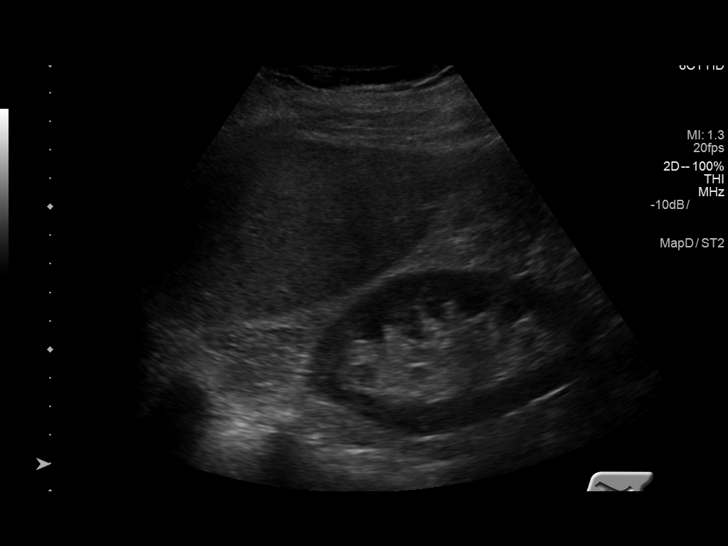

[14 of 25 positions shown; findings below may reference images not displayed]

FINDINGS: Gallbladder:

Gallstones are identified. No wall thickening visualized. No
sonographic Murphy sign noted by sonographer.

Common bile duct:

Diameter: Dilated distally measuring 8.5 mm.

Liver:

No focal lesion identified. Within normal limits in parenchymal
echogenicity.
IMPRESSION: Cholelithiasis without sonographic evidence of acute cholecystitis.

Dilated distal common bowel duct measuring 8.5 mm.

## 2020-05-17 ENCOUNTER — Emergency Department (HOSPITAL_COMMUNITY)
Admission: EM | Admit: 2020-05-17 | Discharge: 2020-05-17 | Disposition: A | Discharge status: Home / Self Care | Payer: Medicare Other | Attending: Emergency Medicine | Admitting: Emergency Medicine

## 2020-05-17 ENCOUNTER — Other Ambulatory Visit: Payer: Self-pay

## 2020-05-17 ENCOUNTER — Encounter (HOSPITAL_COMMUNITY): Payer: Self-pay

## 2020-05-17 DIAGNOSIS — F17210 Nicotine dependence, cigarettes, uncomplicated: Secondary | ICD-10-CM | POA: Insufficient documentation

## 2020-05-17 DIAGNOSIS — T148XXA Other injury of unspecified body region, initial encounter: Secondary | ICD-10-CM | POA: Diagnosis not present

## 2020-05-17 DIAGNOSIS — I10 Essential (primary) hypertension: Secondary | ICD-10-CM | POA: Insufficient documentation

## 2020-05-17 DIAGNOSIS — Z5320 Procedure and treatment not carried out because of patient's decision for unspecified reasons: Secondary | ICD-10-CM | POA: Diagnosis not present

## 2020-05-17 DIAGNOSIS — Y929 Unspecified place or not applicable: Secondary | ICD-10-CM | POA: Diagnosis not present

## 2020-05-17 DIAGNOSIS — J449 Chronic obstructive pulmonary disease, unspecified: Secondary | ICD-10-CM | POA: Insufficient documentation

## 2020-05-17 DIAGNOSIS — I2510 Atherosclerotic heart disease of native coronary artery without angina pectoris: Secondary | ICD-10-CM | POA: Insufficient documentation

## 2020-05-17 DIAGNOSIS — S6992XA Unspecified injury of left wrist, hand and finger(s), initial encounter: Secondary | ICD-10-CM

## 2020-05-17 DIAGNOSIS — Y939 Activity, unspecified: Secondary | ICD-10-CM | POA: Diagnosis not present

## 2020-05-17 DIAGNOSIS — Y999 Unspecified external cause status: Secondary | ICD-10-CM | POA: Insufficient documentation

## 2020-05-17 DIAGNOSIS — X58XXXA Exposure to other specified factors, initial encounter: Secondary | ICD-10-CM | POA: Insufficient documentation

## 2020-05-17 DIAGNOSIS — S61412A Laceration without foreign body of left hand, initial encounter: Secondary | ICD-10-CM | POA: Insufficient documentation

## 2020-05-17 LAB — COMPREHENSIVE METABOLIC PANEL
ALT: 23 U/L (ref 0–44)
AST: 21 U/L (ref 15–41)
Albumin: 3.8 g/dL (ref 3.5–5.0)
Alkaline Phosphatase: 100 U/L (ref 38–126)
Anion gap: 12 (ref 5–15)
BUN: 16 mg/dL (ref 8–23)
CO2: 24 mmol/L (ref 22–32)
Calcium: 9.2 mg/dL (ref 8.9–10.3)
Chloride: 106 mmol/L (ref 98–111)
Creatinine, Ser: 0.76 mg/dL (ref 0.44–1.00)
GFR calc Af Amer: >60 mL/min (ref >60)
GFR calc non Af Amer: >60 mL/min (ref >60)
Glucose, Bld: 95 mg/dL (ref 70–99)
Potassium: 3.9 mmol/L (ref 3.5–5.1)
Sodium: 142 mmol/L (ref 135–145)
Total Bilirubin: 0.3 mg/dL (ref 0.3–1.2)
Total Protein: 7 g/dL (ref 6.5–8.1)

## 2020-05-17 LAB — URINALYSIS, ROUTINE W REFLEX MICROSCOPIC
Bilirubin Urine: NEGATIVE
Glucose, UA: NEGATIVE mg/dL
Hgb urine dipstick: NEGATIVE
Ketones, ur: NEGATIVE mg/dL
Leukocytes,Ua: NEGATIVE
Nitrite: NEGATIVE
Protein, ur: NEGATIVE mg/dL
Specific Gravity, Urine: 1.004 — ABNORMAL LOW (ref 1.005–1.030)
pH: 5 (ref 5.0–8.0)

## 2020-05-17 LAB — CBC
HCT: 43.9 % (ref 36.0–46.0)
Hemoglobin: 14.6 g/dL (ref 12.0–15.0)
MCH: 30.7 pg (ref 26.0–34.0)
MCHC: 33.3 g/dL (ref 30.0–36.0)
MCV: 92.2 fL (ref 80.0–100.0)
Platelets: 181 10*3/uL (ref 150–400)
RBC: 4.76 MIL/uL (ref 3.87–5.11)
RDW: 13.4 % (ref 11.5–15.5)
WBC: 8.3 10*3/uL (ref 4.0–10.5)
nRBC: 0 % (ref 0.0–0.2)

## 2020-05-17 MED ORDER — CEPHALEXIN 500 MG PO CAPS
500.0000 mg | ORAL_CAPSULE | Freq: Three times a day (TID) | ORAL | 0 refills | Status: DC
Start: 2020-05-17 — End: 2020-05-17

## 2020-05-17 MED ORDER — SODIUM CHLORIDE 0.9% FLUSH: 3.0000 mL | Freq: Once | INTRAVENOUS | Status: DC

## 2020-05-17 MED ORDER — CEPHALEXIN 500 MG PO CAPS
500.0000 mg | ORAL_CAPSULE | Freq: Three times a day (TID) | ORAL | 0 refills | Status: AC
Start: 2020-05-17 — End: 2020-05-22

## 2020-05-17 NOTE — ED Notes (Signed)
Pt has hematoma to head, refuses to have head CT, states it is to expensive

## 2020-05-17 NOTE — ED Notes (Signed)
Pt. Insisted to leave AMA. Provider at bedside. Risk and benefits discussed. Pt. States, "I still want to leave".    

## 2020-05-17 NOTE — Discharge Instructions (Signed)
You are leaving the emergency department [place].  We advise that you undergo CT imaging of your head to exclude internal bleeding.  Risks of leaving the hospital include worsening condition, worsening bleeding, death.  You were made aware of these risks and you still chose to leave without this imaging test.  You can return to the emergency department at any time for continued care.  Regarding your injuries, please check them and change the dressing daily.  Use the antibiotics as directed.

## 2020-05-17 NOTE — ED Provider Notes (Signed)
[facility] Emergency Department Provider Note MRN:  [id number]  Arrival date & time: 05/17/20     Chief Complaint   Laceration History of Present Illness   Raven Thompson is a 74 y.o. year-old female with a history of CAD COPD presenting to the ED with chief complaint of laceration.  Patient does not remember how she hurt herself.  At some point yesterday evening, she sustained multiple injuries.  She has bruising to her forehead, laceration to her right hand, skin tears to her left arm.  She denies drugs or alcohol.  She is unsure if she fell.  Pain is mild to moderate, constant, worse with motion or palpation.  Denies chest pain or shortness of breath, no abdominal pain.  Review of Systems  A complete 10 system review of systems was obtained and all systems are negative except as noted in the HPI and PMH.   Patient's Health History    Past Medical History:  Diagnosis Date  . CAD (coronary artery disease)   . COPD (chronic obstructive pulmonary disease) ([place])   . Hypertension   . MI (myocardial infarction) [facility])     Past Surgical History:  Procedure Laterality Date  . ABDOMINAL HYSTERECTOMY    . CORONARY ANGIOPLASTY WITH STENT PLACEMENT    . TONSILLECTOMY      No family history on file.  Social History   Socioeconomic History  . Marital status: Single    Spouse name: Not on file  . Number of children: Not on file  . Years of education: Not on file  . Highest education level: Not on file  Occupational History  . Not on file  Tobacco Use  . Smoking status: Current Some Day Smoker    Types: Cigarettes  . Smokeless tobacco: Never Used  Substance and Sexual Activity  . Alcohol use: No  . Drug use: No  . Sexual activity: Not Currently    Birth control/protection: Surgical  Other Topics Concern  . Not on file  Social History Narrative  . Not on file   Social Determinants of Health   Financial Resource Strain:   . Difficulty of Paying  Living Expenses:   Food Insecurity:   . Worried About Charity fundraiser in the Last Year:   . Arboriculturist in the Last Year:   Transportation Needs:   . Film/video editor (Medical):   Marland Kitchen Lack of Transportation (Non-Medical):   Physical Activity:   . Days of Exercise per Week:   . Minutes of Exercise per Session:   Stress:   . Feeling of Stress :   Social Connections:   . Frequency of Communication with Friends and Family:   . Frequency of Social Gatherings with Friends and Family:   . Attends Religious Services:   . Active Member of Clubs or Organizations:   . Attends Archivist Meetings:   Marland Kitchen Marital Status:   Intimate Partner Violence:   . Fear of Current or Ex-Partner:   . Emotionally Abused:   Marland Kitchen Physically Abused:   . Sexually Abused:      Physical Exam   Vitals:   05/17/20 2115  BP: (!) 173/83  Pulse: 74  Resp: 20  Temp: 98.4 F (36.9 C)  SpO2: 96%    CONSTITUTIONAL: Well-appearing, NAD NEURO:  Alert and oriented x 3, no focal deficits EYES:  eyes equal and reactive ENT/NECK:  no LAD, no JVD CARDIO: Regular rate, well-perfused, normal S1 and S2  PULM:  CTAB no wheezing or rhonchi GI/GU:  normal bowel sounds, non-distended, non-tender MSK/SPINE:  No gross deformities, no edema SKIN: Superficial laceration to the right palm, 7 cm in length; small bruising to mid forehead, small skin tears to left upper arm PSYCH:  Appropriate speech and behavior  *Additional and/or pertinent findings included in MDM below  Diagnostic and Interventional Summary    EKG Interpretation  Date/Time:    Ventricular Rate:    PR Interval:    QRS Duration:   QT Interval:    QTC Calculation:   R Axis:     Text Interpretation:        Labs Reviewed  URINALYSIS, ROUTINE W REFLEX MICROSCOPIC - Abnormal; Notable for the following components:      Result Value   Color, Urine COLORLESS (*)    Specific Gravity, Urine 1.004 (*)    All other components within  normal limits  COMPREHENSIVE METABOLIC PANEL  CBC  CBG MONITORING, ED    No orders to display    Medications  sodium chloride flush (NS) 0.9 % injection 3 mL (has no administration in time range)     Procedures  /  Critical Care Procedures  ED Course and Medical Decision Making  I have reviewed the triage vital signs, the nursing notes, and pertinent available records from the EMR.  Listed above are laboratory and imaging tests that I personally ordered, reviewed, and interpreted and then considered in my medical decision making (see below for details).      Unknown mechanism of injury, will wash wounds here in the emergency department as they have not been washed.  The right hand would have been appropriate for sutures if she presented early enough, now 24 hours later there is signs of secondary healing and so we will leave it to heal by secondary intention.  Will provide prophylactic Keflex.  No indication for imaging, no bony tenderness.  Work-up here reassuring.  Patient is refusing head CT, claiming it is too expensive.  Explained to her the concern for possible retrograde amnesia, concern for intracranial bleeding, concern for this condition worsening and possibly causing death.  Still she refuses.  She is leaving [place].    Barth Kirks. Sedonia Small, [place] [email or web address]  Final Clinical Impressions(s) / ED Diagnoses     ICD-10-CM   1. Injury of left hand, initial encounter  S69.92XA     ED Discharge Orders         Ordered    cephALEXin (KEFLEX) 500 MG capsule  3 times daily     05/17/20 2304           Discharge Instructions Discussed with and Provided to Patient:     Discharge Instructions     You are leaving the emergency department [place].  We advise that you undergo CT imaging of your head to exclude internal bleeding.  Risks of leaving the hospital include worsening  condition, worsening bleeding, death.  You were made aware of these risks and you still chose to leave without this imaging test.  You can return to the emergency department at any time for continued care.  Regarding your injuries, please check them and change the dressing daily.  Use the antibiotics as directed.       Maudie Flakes, MD 05/17/20 [id number]

## 2020-05-17 NOTE — ED Triage Notes (Signed)
Pt comes with several wounds and no idea of how her injuries happened. Pt Axo X3, disoriented to situation. Pt states she woke up with injuries, laceration appx 3 in across the palm of R hand, abrasion and bruising to L arm, small bruising  and hematoma to forehead.

## 2021-02-20 ENCOUNTER — Emergency Department (HOSPITAL_COMMUNITY)
Admission: EM | Admit: 2021-02-20 | Discharge: 2021-02-23 | Disposition: A | Discharge status: Other Acute Inpatient Hospital | Payer: Medicare Other | Attending: Emergency Medicine | Admitting: Emergency Medicine

## 2021-02-20 ENCOUNTER — Emergency Department (HOSPITAL_COMMUNITY): Payer: Medicare Other

## 2021-02-20 ENCOUNTER — Other Ambulatory Visit: Payer: Self-pay

## 2021-02-20 ENCOUNTER — Encounter (HOSPITAL_COMMUNITY): Payer: Self-pay

## 2021-02-20 DIAGNOSIS — Z20822 Contact with and (suspected) exposure to covid-19: Secondary | ICD-10-CM | POA: Insufficient documentation

## 2021-02-20 DIAGNOSIS — F17210 Nicotine dependence, cigarettes, uncomplicated: Secondary | ICD-10-CM | POA: Insufficient documentation

## 2021-02-20 DIAGNOSIS — I2510 Atherosclerotic heart disease of native coronary artery without angina pectoris: Secondary | ICD-10-CM | POA: Insufficient documentation

## 2021-02-20 DIAGNOSIS — Z7982 Long term (current) use of aspirin: Secondary | ICD-10-CM | POA: Insufficient documentation

## 2021-02-20 DIAGNOSIS — R451 Restlessness and agitation: Secondary | ICD-10-CM | POA: Diagnosis not present

## 2021-02-20 DIAGNOSIS — J449 Chronic obstructive pulmonary disease, unspecified: Secondary | ICD-10-CM | POA: Insufficient documentation

## 2021-02-20 DIAGNOSIS — R519 Headache, unspecified: Secondary | ICD-10-CM | POA: Insufficient documentation

## 2021-02-20 DIAGNOSIS — Z79899 Other long term (current) drug therapy: Secondary | ICD-10-CM | POA: Diagnosis not present

## 2021-02-20 DIAGNOSIS — F0391 Unspecified dementia with behavioral disturbance: Secondary | ICD-10-CM | POA: Diagnosis present

## 2021-02-20 DIAGNOSIS — F22 Delusional disorders: Secondary | ICD-10-CM

## 2021-02-20 DIAGNOSIS — Z951 Presence of aortocoronary bypass graft: Secondary | ICD-10-CM | POA: Insufficient documentation

## 2021-02-20 DIAGNOSIS — F29 Unspecified psychosis not due to a substance or known physiological condition: Secondary | ICD-10-CM | POA: Diagnosis present

## 2021-02-20 DIAGNOSIS — Z7901 Long term (current) use of anticoagulants: Secondary | ICD-10-CM | POA: Insufficient documentation

## 2021-02-20 DIAGNOSIS — I10 Essential (primary) hypertension: Secondary | ICD-10-CM | POA: Diagnosis not present

## 2021-02-20 DIAGNOSIS — Z046 Encounter for general psychiatric examination, requested by authority: Secondary | ICD-10-CM | POA: Insufficient documentation

## 2021-02-20 DIAGNOSIS — F03918 Unspecified dementia, unspecified severity, with other behavioral disturbance: Secondary | ICD-10-CM | POA: Diagnosis present

## 2021-02-20 LAB — CBC WITH DIFFERENTIAL/PLATELET
Abs Immature Granulocytes: 0.02 10*3/uL (ref 0.00–0.07)
Basophils Absolute: 0 10*3/uL (ref 0.0–0.1)
Basophils Relative: 1 %
Eosinophils Absolute: 0 10*3/uL (ref 0.0–0.5)
Eosinophils Relative: 1 %
HCT: 45.7 % (ref 36.0–46.0)
Hemoglobin: 14.5 g/dL (ref 12.0–15.0)
Immature Granulocytes: 0 %
Lymphocytes Relative: 26 %
Lymphs Abs: 2.3 10*3/uL (ref 0.7–4.0)
MCH: 30.4 pg (ref 26.0–34.0)
MCHC: 31.7 g/dL (ref 30.0–36.0)
MCV: 95.8 fL (ref 80.0–100.0)
Monocytes Absolute: 0.9 10*3/uL (ref 0.1–1.0)
Monocytes Relative: 10 %
Neutro Abs: 5.6 10*3/uL (ref 1.7–7.7)
Neutrophils Relative %: 62 %
Platelets: 173 10*3/uL (ref 150–400)
RBC: 4.77 MIL/uL (ref 3.87–5.11)
RDW: 13.3 % (ref 11.5–15.5)
WBC: 8.9 10*3/uL (ref 4.0–10.5)
nRBC: 0 % (ref 0.0–0.2)

## 2021-02-20 LAB — SALICYLATE LEVEL: Salicylate Lvl: <7 mg/dL — ABNORMAL LOW (ref 7.0–30.0)

## 2021-02-20 LAB — COMPREHENSIVE METABOLIC PANEL
ALT: 38 U/L (ref 0–44)
AST: 36 U/L (ref 15–41)
Albumin: 4.8 g/dL (ref 3.5–5.0)
Alkaline Phosphatase: 95 U/L (ref 38–126)
Anion gap: 18 — ABNORMAL HIGH (ref 5–15)
BUN: 15 mg/dL (ref 8–23)
CO2: 20 mmol/L — ABNORMAL LOW (ref 22–32)
Calcium: 10.2 mg/dL (ref 8.9–10.3)
Chloride: 107 mmol/L (ref 98–111)
Creatinine, Ser: 0.92 mg/dL (ref 0.44–1.00)
GFR, Estimated: >60 mL/min (ref >60)
Glucose, Bld: 108 mg/dL — ABNORMAL HIGH (ref 70–99)
Potassium: 3.9 mmol/L (ref 3.5–5.1)
Sodium: 145 mmol/L (ref 135–145)
Total Bilirubin: 0.9 mg/dL (ref 0.3–1.2)
Total Protein: 8.2 g/dL — ABNORMAL HIGH (ref 6.5–8.1)

## 2021-02-20 LAB — ETHANOL: Alcohol, Ethyl (B): <10 mg/dL (ref <10)

## 2021-02-20 LAB — ACETAMINOPHEN LEVEL: Acetaminophen (Tylenol), Serum: <10 ug/mL — ABNORMAL LOW (ref 10–30)

## 2021-02-20 LAB — URINALYSIS, ROUTINE W REFLEX MICROSCOPIC
Bilirubin Urine: NEGATIVE
Glucose, UA: NEGATIVE mg/dL
Hgb urine dipstick: NEGATIVE
Ketones, ur: NEGATIVE mg/dL
Leukocytes,Ua: NEGATIVE
Nitrite: NEGATIVE
Protein, ur: NEGATIVE mg/dL
Specific Gravity, Urine: 1.009 (ref 1.005–1.030)
pH: 5 (ref 5.0–8.0)

## 2021-02-20 LAB — RAPID URINE DRUG SCREEN, HOSP PERFORMED
Amphetamines: NOT DETECTED
Barbiturates: NOT DETECTED
Benzodiazepines: NOT DETECTED
Cocaine: NOT DETECTED
Opiates: NOT DETECTED
Tetrahydrocannabinol: NOT DETECTED

## 2021-02-20 LAB — RESP PANEL BY RT-PCR (FLU A&B, COVID) ARPGX2
Influenza A by PCR: NEGATIVE
Influenza B by PCR: NEGATIVE
SARS Coronavirus 2 by RT PCR: NEGATIVE

## 2021-02-20 MED ORDER — ZIPRASIDONE MESYLATE 20 MG IM SOLR
20.0000 mg | Freq: Once | INTRAMUSCULAR | Status: AC
  Administered 2021-02-20: 20 mg via INTRAMUSCULAR
  Filled 2021-02-20: qty 20

## 2021-02-20 MED ORDER — LORAZEPAM 2 MG/ML IJ SOLN
INTRAMUSCULAR | Status: AC
  Administered 2021-02-20: 2 mg via INTRAMUSCULAR
  Filled 2021-02-20: qty 1

## 2021-02-20 MED ORDER — STERILE WATER FOR INJECTION IJ SOLN
INTRAMUSCULAR | Status: AC
  Filled 2021-02-20: qty 10

## 2021-02-20 MED ORDER — NICOTINE 21 MG/24HR TD PT24: 21.0000 mg | MEDICATED_PATCH | Freq: Once | TRANSDERMAL | Status: DC

## 2021-02-20 MED ORDER — LORAZEPAM 2 MG/ML IJ SOLN: 2.0000 mg | Freq: Once | INTRAMUSCULAR | Status: AC

## 2021-02-20 MED ORDER — SODIUM CHLORIDE 0.9 % IV BOLUS
1000.0000 mL | Freq: Once | INTRAVENOUS | Status: AC
  Administered 2021-02-20: 1000 mL via INTRAVENOUS

## 2021-02-20 NOTE — ED Notes (Signed)
Restraints have been discontinued. Pt currently resting in bed comfortably. Call light within reach. Pt has no complaints at this time.

## 2021-02-20 NOTE — ED Provider Notes (Incomplete)
MSE was initiated and I personally evaluated the patient and placed orders (if any) at  10:52 PM on February 20, 2021.  The patient appears stable so that the remainder of the MSE may be completed by another Lorenza Shakir.  Soft restraints -   geodon - 10:55
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
## 2021-02-20 NOTE — ED Notes (Addendum)
Assumed care of this patient. Pt noted to be very agitated and fighting restraints with stretcher. Pt notified if cooperative restraints will be removed. MD aware. Will reassess patient.

## 2021-02-20 NOTE — ED Notes (Signed)
Pt yelling at staff and security to leave her alone as patient is swaying and seeming to almost fall. This RN asked pt to allow staff to help her to prevent fall and injury.

## 2021-02-20 NOTE — ED Notes (Addendum)
Pt screaming at staff. Pt currently in bed. Pt pulling her arms back, seeming to be about to hit staff.

## 2021-02-20 NOTE — ED Notes (Signed)
Purewick placed on patient.

## 2021-02-20 NOTE — ED Notes (Signed)
Pt back from CT
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
## 2021-02-20 NOTE — ED Notes (Signed)
Pt combative, pt walked back to room from bathroom and started screaming at staff and pushing at security. Security and staff trying to help patient back to room. Joline Maxcy, PA bedside and aware.
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
## 2021-02-21 ENCOUNTER — Emergency Department (HOSPITAL_COMMUNITY): Payer: Medicare Other

## 2021-02-21 DIAGNOSIS — F22 Delusional disorders: Secondary | ICD-10-CM | POA: Diagnosis not present

## 2021-02-21 MED ORDER — ZIPRASIDONE MESYLATE 20 MG IM SOLR
10.0000 mg | Freq: Once | INTRAMUSCULAR | Status: AC
  Administered 2021-02-21: 10 mg via INTRAMUSCULAR
  Filled 2021-02-21: qty 20

## 2021-02-21 MED ORDER — LORAZEPAM 2 MG/ML IJ SOLN: 2.0000 mg | Freq: Once | INTRAMUSCULAR | Status: DC

## 2021-02-21 MED ORDER — HALOPERIDOL LACTATE 5 MG/ML IJ SOLN
2.0000 mg | Freq: Once | INTRAMUSCULAR | Status: DC
  Filled 2021-02-21: qty 1

## 2021-02-21 MED ORDER — QUETIAPINE FUMARATE 25 MG PO TABS
25.0000 mg | ORAL_TABLET | Freq: Every day | ORAL | Status: DC
  Filled 2021-02-21: qty 1

## 2021-02-21 MED ORDER — DIPHENHYDRAMINE HCL 50 MG/ML IJ SOLN
12.5000 mg | Freq: Once | INTRAMUSCULAR | Status: DC
  Filled 2021-02-21: qty 1

## 2021-02-21 MED ORDER — STERILE WATER FOR INJECTION IJ SOLN
INTRAMUSCULAR | Status: AC
  Administered 2021-02-21: 1.2 mL
  Filled 2021-02-21: qty 10

## 2021-02-21 MED ORDER — LORAZEPAM 2 MG/ML IJ SOLN
2.0000 mg | Freq: Once | INTRAMUSCULAR | Status: AC
  Administered 2021-02-21: 2 mg via INTRAMUSCULAR
  Filled 2021-02-21: qty 1

## 2021-02-21 MED ORDER — ZIPRASIDONE MESYLATE 20 MG IM SOLR: 20.0000 mg | Freq: Once | INTRAMUSCULAR | Status: DC

## 2021-02-21 NOTE — ED Notes (Signed)
Pt expresses "want to sit in a chair". This RN told patient that pt needs to stay in bed for safety reasons. Pt then states "why do we have to do the sex at all"

## 2021-02-21 NOTE — ED Notes (Signed)
Pt states "I have to go and check on my kids. They are locked down and they can't get out. They can't breathe"

## 2021-02-21 NOTE — ED Notes (Signed)
Pt placed in wine scrubs. All belongings placed in patient belonging bag, labelled, and stored in cabinet designated cabinet for rm 25 belongings.
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
## 2021-02-21 NOTE — ED Notes (Signed)
Pt agitated while speaking to daughter on phone. Refusing to sit down. Pt is IVC'd and aware of this but is expressing strong desire to leave. Security and MD notified.
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
## 2021-02-21 NOTE — ED Notes (Signed)
Pt given dinner tray.
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
## 2021-02-21 NOTE — ED Notes (Signed)
Pt appears less agitated upon assessment. Pt agreeable to eating meal tray with bilateral wrists released from restraints. After sleeping, Pt seems reasonable, redirectable and resting comfortably. Pt seemed to become agitated earlier due to speaking with daughter on the phone. This RN removed phone from room upon start of violent restraints today

## 2021-02-21 NOTE — ED Notes (Signed)
Pt states "I need to go to the bathroom". This RN offered purewick or bedpan as pt expressed earlier "I need to go to the bathroom so I can get away". Upon offering purewick and bedpan pt refused to speak to this RN

## 2021-02-21 NOTE — ED Notes (Signed)
Pt given breakfast tray

## 2021-02-21 NOTE — ED Provider Notes (Signed)
Notified by RN that patient was agitated, hitting, and kicking staff.  Security was at bedside.  When I evaluated the patient, patient was unable to be redirected.  She was actively attempting to assault staff and was starting to leave the department.  Patient was actively under IVC at this time.  22:55- Patient was placed in hard restraints and Geodon was ordered.   23:55-When I re-evaluated the patient, she was still screaming and demanding to leave the department.  She continued making threats against staff and remained a safety risk to both herself and others. Restraints were continued.  Patient was reevaluated multiple times over the next few hours.  She required a second dose of Ativan.  After second dose of Ativan, restraints were were able to be discontinued and patient was resting comfortably at bedside.  Remains hemodynamically stable and in no acute distress.  .Critical Care Performed by: Joanne Gavel, PA-C Authorized by: Joanne Gavel, PA-C   Critical care provider statement:    Critical care time (minutes):  45   Critical care time was exclusive of:  Separately billable procedures and treating other patients and teaching time   Critical care was necessary to treat or prevent imminent or life-threatening deterioration of the following conditions: Agitation and psychosis.   Critical care was time spent personally by me on the following activities:  Ordering and performing treatments and interventions, evaluation of patient's response to treatment, examination of patient, review of old charts and re-evaluation of patient's condition   I assumed direction of critical care for this patient from another provider in my specialty: no        Joanne Gavel, PA-C 02/21/21 0653    Lacretia Leigh, MD 02/26/21 1032

## 2021-02-21 NOTE — ED Notes (Signed)
Attempted to reassess patient restraints. Pt states" I don't listen to your rules, you will take me out of these and I am going to walk out of here". Pt agitated and pulling at restraints. Offered pt restroom assistance and patient refuses. MD aware.

## 2021-02-21 NOTE — BH Assessment (Signed)
Clinician Roxanne Mins, RN that she's ready to assess the pt, if the pt is able to engage.   Clinician awaiting response.    Vertell Novak, [place], [facility], [facility] Triage Specialist [phone]

## 2021-02-21 NOTE — BH Assessment (Signed)
Per Caryl Pina, RN pt has been agitated, uncooperative and she doesn't feel the pt will engage in TTS assessment. Clinician asked RN to call or message when the pt is calm and able to answer questions.    Vertell Novak, MS, [facility], [facility] Triage Specialist [phone].

## 2021-02-21 NOTE — ED Notes (Signed)
Sitter at bedside.

## 2021-02-21 NOTE — ED Notes (Signed)
Pt states "i'm not taking any pills. I have to go take care of thing. You're going to kill me"

## 2021-02-21 NOTE — ED Notes (Addendum)
Violent restraints discontinued per provider order

## 2021-02-21 NOTE — BH Assessment (Signed)
Pt consented for clinician to contact her daughter [name], [phone]) to obtain additional information however the phone rang once then went to voicemail. Clinician left a HIPPA complaint voice message with call back information.    Vertell Novak, [place], [facility], [facility] Triage Specialist [phone]

## 2021-02-21 NOTE — ED Notes (Signed)
Pt ambulated to bathroom with minimal assistance.

## 2021-02-21 NOTE — BH Assessment (Signed)
[place] Assessment Progress Note  Reviewing pt's EPIC record, this writer found that pt has a health care power of attorney.  Document lists, in order, Lynnea Maizes ([phone]), Tenna Delaine [phone]), and [name] [phone]).  At 15:44 this writer called the first number; no one picked up.  At 15:45 I called Tenna Delaine, who responded.  She reports that the three named parties are pt's daughters.  I informed her that pt is in the ED and that she presented with hallucinations and delusional thoughts, which Gwenette Greet already knew.  I added that pt is under IVC and that our psychiatry team has found that the pt requires psychiatric hospitalization at this time.  Gwenette Greet was in agreement with this.  I noted that I have contacted every private geriatric psychiatry unit in [place], but so far have not found a bed.  I informed her that in the meantime, psychiatry will round on her regularly in an effort to stabilize her in the ED, which Gwenette Greet understands.  She agrees to share this information with the other named HCPOA's.  Please keep them updated about pt's progress.  Jalene Mullet, [place] Coordinator [phone]

## 2021-02-21 NOTE — BH Assessment (Addendum)
[place] Assessment Progress Note  Per Lindon Romp, NP, this pt requires psychiatric hospitalization at a facility providing specialty care for geriatric patients at this time.  Pt presents under IVC initiated by EDP Lavenia Atlas, DO.  The following facilities have been contacted to seek placement for this pt, with results as noted:  Beds available, information sent, decision pending: [facility] ([place]) [place]: [facility]  At capacity: [facility] (geriatric unit currently closed) [facility] and [place]) [place], [place] Coordinator [phone]

## 2021-02-21 NOTE — ED Notes (Signed)
Phone removed from room. Pt increasingly agitated and threatening staff verbally. Pt threatening to leave. Pt states "your plan is to have all these people come in and sex me"

## 2021-02-21 NOTE — BH Assessment (Signed)
Disposition: Lindon Romp, PMHNP recommends geropsychiatric inpatient treatment. Disposition CSW to seek placement. Disposition dicussed with Caryl Pina, RN. RN to discuss disposition with EDP.     Flowsheet Row ED from 02/20/2021 in [place] DEPT  C-SSRS RISK CATEGORY No Risk     Per Caryl Pina, RN the pt does not have a sitter.   Vertell Novak, [place], [facility], [facility] Triage Specialist [phone]

## 2021-02-22 DIAGNOSIS — F22 Delusional disorders: Secondary | ICD-10-CM | POA: Diagnosis not present

## 2021-02-22 MED ORDER — STERILE WATER FOR INJECTION IJ SOLN
INTRAMUSCULAR | Status: AC
  Administered 2021-02-22: 1.2 mL
  Filled 2021-02-22: qty 10

## 2021-02-22 MED ORDER — ZIPRASIDONE MESYLATE 20 MG IM SOLR
10.0000 mg | Freq: Two times a day (BID) | INTRAMUSCULAR | Status: AC | PRN
Start: 2021-02-22 — End: 2021-02-23
  Administered 2021-02-22 – 2021-02-23 (×2): 10 mg via INTRAMUSCULAR
  Filled 2021-02-22 (×2): qty 20

## 2021-02-22 MED ORDER — STERILE WATER FOR INJECTION IJ SOLN
INTRAMUSCULAR | Status: AC
  Filled 2021-02-22: qty 10

## 2021-02-22 MED ORDER — ZIPRASIDONE MESYLATE 20 MG IM SOLR
10.0000 mg | Freq: Once | INTRAMUSCULAR | Status: AC
  Administered 2021-02-22: 10 mg via INTRAMUSCULAR
  Filled 2021-02-22: qty 20

## 2021-02-22 NOTE — ED Notes (Signed)
Patient attempting to elope from unit.

## 2021-02-22 NOTE — ED Notes (Signed)
Patient becoming increasingly agitated, continuing to try to get out of bed.  Attempted talking with patient about behavioral expectations and that we need her to demonstrate that she will not be a danger to herself if wrist restraints are removed.  Discussed a trial when breakfast comes and that we will remove restraints for breakfast.

## 2021-02-22 NOTE — ED Notes (Signed)
Patient attempting to walk around unit, security and this RN standing in doorway trying to convince patient to remain in room and explaining that we need her to stay in her room so we don't have to put the restraints back on.  Spoke with Dr. Zenia Resides about giving Geodon, new order received, will attempt to gain compliance again before using medication.

## 2021-02-22 NOTE — ED Notes (Addendum)
Patient entered nurses station then attempted to elope from unit.  Stating, "Oh, I'm leaving.  I don't have to listen to you." Patient refusing redirection.

## 2021-02-22 NOTE — ED Notes (Signed)
Patient restraints removed, behavioral expectations explained to patient, patient given cup of water.

## 2021-02-22 NOTE — ED Notes (Signed)
Attempting to gain compliance with directions before using chemical or physical restraints, patient still in doorway insisting on leaving but not yet being combative.

## 2021-02-22 NOTE — ED Provider Notes (Signed)
Patient seen on rounds today.  Resting comfortably at this time.  Awaiting gero- psych placement   Lacretia Leigh, MD 02/22/21 1439

## 2021-02-22 NOTE — ED Notes (Signed)
Patient being more cooperative now, will hold Geodon as long as patient is cooperative.

## 2021-02-22 NOTE — ED Provider Notes (Signed)
6:36 PM Patient agitated, climbing from the bed, uncooperative, wrestling with staff. She has received Geodon, soft waist restraints being placed.   Carmin Muskrat, MD 02/22/21 [phone]

## 2021-02-22 NOTE — Consult Note (Signed)
[facility] Psych ED Progress  Note  Reason for Consult: Psychiatry provider reassessment  Referring Physician: Dr. Almyra Free Location of Patient: [facility] emergency department   Patient Identification: Raven Thompson MRN:  [id number] Principal Diagnosis: Delusional disorder [employer]) Diagnosis:  Principal Problem:   Delusional disorder ([place])   Total Time spent with patient: 30 minutes  Subjective:   Raven Thompson is a 75 y.o. female patient admitted with hallucinations.  Patient remains unable to partcipate in psych evalaution due to disorientation, paranoia, and confusion. SHe remains paranoid refusing to tell me where she lives and who she lives with. She is unable to answer questions appropriately secondary to her confusion.   HPI:   Raven Thompson is a 75 year old female who presents involuntary and unaccompanied to [facility]. Pt was a poor historian during the assessment. Clinician asked the pt, "what brought you to the hospital?" Pt reported, "my daughter had been listening to...." Pt then started talking something you put on your bladder. Clinician asked the pt again, "what brought you to the hospital?" Pt reported, because her daughter was busy at work. As clinician asked the pt questions, pt said, "who are you saying this too?" Clinician replied, "you," Pt then appeared to fall asleep. Pt denied, SI, HI, AVH, self-injurious behaviors and access to weapons.   Past Psychiatric History: Delusional disorder  Risk to Self:   Denies Risk to Others:   Denies Prior Inpatient Therapy:   [facility] x2 Prior Outpatient Therapy:   Approx 2 years ago  Past Medical History:  Past Medical History:  Diagnosis Date  . CAD (coronary artery disease)   . COPD (chronic obstructive pulmonary disease) ([place])   . Hypertension   . MI (myocardial infarction) [facility])     Past Surgical History:  Procedure Laterality Date  . ABDOMINAL HYSTERECTOMY    . CORONARY ANGIOPLASTY WITH STENT  PLACEMENT    . TONSILLECTOMY     Family History: History reviewed. No pertinent family history. Family Psychiatric  History: None reported Social History:  Social History   Substance and Sexual Activity  Alcohol Use No     Social History   Substance and Sexual Activity  Drug Use No    Social History   Socioeconomic History  . Marital status: Single    Spouse name: Not on file  . Number of children: Not on file  . Years of education: Not on file  . Highest education level: Not on file  Occupational History  . Not on file  Tobacco Use  . Smoking status: Current Some Day Smoker    Types: Cigarettes  . Smokeless tobacco: Never Used  Substance and Sexual Activity  . Alcohol use: No  . Drug use: No  . Sexual activity: Not Currently    Birth control/protection: Surgical  Other Topics Concern  . Not on file  Social History Narrative  . Not on file   Social Determinants of Health   Financial Resource Strain: Not on file  Food Insecurity: Not on file  Transportation Needs: Not on file  Physical Activity: Not on file  Stress: Not on file  Social Connections: Not on file   Additional Social History:    Allergies:   Allergies  Allergen Reactions  . Sulfa Antibiotics Nausea Only    Labs:  Results for orders placed or performed during the hospital encounter of 02/20/21 (from the past 48 hour(s))  Resp Panel by RT-PCR (Flu A&B, Covid) Nasopharyngeal Swab  Status: None   Collection Time: 02/20/21  5:04 PM   Specimen: Nasopharyngeal Swab; Nasopharyngeal(NP) swabs in vial transport medium  Result Value Ref Range   SARS Coronavirus 2 by RT PCR NEGATIVE NEGATIVE    Comment: (NOTE) SARS-CoV-2 target nucleic acids are NOT DETECTED.  The SARS-CoV-2 RNA is generally detectable in upper respiratory specimens during the acute phase of infection. The lowest concentration of SARS-CoV-2 viral copies this assay can detect is 138 copies/mL. A negative result does not  preclude SARS-Cov-2 infection and should not be used as the sole basis for treatment or other patient management decisions. A negative result may occur with  improper specimen collection/handling, submission of specimen other than nasopharyngeal swab, presence of viral mutation(s) within the areas targeted by this assay, and inadequate number of viral copies(<138 copies/mL). A negative result must be combined with clinical observations, patient history, and epidemiological information. The expected result is Negative.  Fact Sheet for Patients:  EntrepreneurPulse.com.au  Fact Sheet for Healthcare Providers:  IncredibleEmployment.be  This test is no t yet approved or cleared by the [place] FDA and  has been authorized for detection and/or diagnosis of SARS-CoV-2 by FDA under an Emergency Use Authorization (EUA). This EUA will remain  in effect (meaning this test can be used) for the duration of the COVID-19 declaration under Section 564(b)(1) of the Act, 21 U.S.C.section 360bbb-3(b)(1), unless the authorization is terminated  or revoked sooner.       Influenza A by PCR NEGATIVE NEGATIVE   Influenza B by PCR NEGATIVE NEGATIVE    Comment: (NOTE) The Xpert Xpress SARS-CoV-2/FLU/RSV plus assay is intended as an aid in the diagnosis of influenza from Nasopharyngeal swab specimens and should not be used as a sole basis for treatment. Nasal washings and aspirates are unacceptable for Xpert Xpress SARS-CoV-2/FLU/RSV testing.  Fact Sheet for Patients: EntrepreneurPulse.com.au  Fact Sheet for Healthcare Providers: IncredibleEmployment.be  This test is not yet approved or cleared by the [place] FDA and has been authorized for detection and/or diagnosis of SARS-CoV-2 by FDA under an Emergency Use Authorization (EUA). This EUA will remain in effect (meaning this test can be used) for the duration of  the COVID-19 declaration under Section 564(b)(1) of the Act, 21 U.S.C. section 360bbb-3(b)(1), unless the authorization is terminated or revoked.  Performed at [facility], [place] [address] [place], [place] [address]   Comprehensive metabolic panel     Status: Abnormal   Collection Time: 02/20/21  5:19 PM  Result Value Ref Range   Sodium 145 135 - 145 mmol/L   Potassium 3.9 3.5 - 5.1 mmol/L   Chloride 107 98 - 111 mmol/L   CO2 20 (L) 22 - 32 mmol/L   Glucose, Bld 108 (H) 70 - 99 mg/dL    Comment: Glucose reference range applies only to samples taken after fasting for at least 8 hours.   BUN 15 8 - 23 mg/dL   Creatinine, Ser 0.92 0.44 - 1.00 mg/dL   Calcium 10.2 8.9 - 10.3 mg/dL   Total Protein 8.2 (H) 6.5 - 8.1 g/dL   Albumin 4.8 3.5 - 5.0 g/dL   AST 36 15 - 41 U/L   ALT 38 0 - 44 U/L   Alkaline Phosphatase 95 38 - 126 U/L   Total Bilirubin 0.9 0.3 - 1.2 mg/dL   GFR, Estimated >60 >60 mL/min    Comment: (NOTE) Calculated using the CKD-EPI Creatinine Equation (2021)    Anion gap 18 (H) 5 - 15  Comment: Performed at [facility], [place] [address], [place], [place] [address]  Ethanol     Status: None   Collection Time: 02/20/21  5:19 PM  Result Value Ref Range   Alcohol, Ethyl (B) <10 <10 mg/dL    Comment: (NOTE) Lowest detectable limit for serum alcohol is 10 mg/dL.  For medical purposes only. Performed at [facility], [place] [address] [place], [place] [address]   CBC with Diff     Status: None   Collection Time: 02/20/21  5:19 PM  Result Value Ref Range   WBC 8.9 4.0 - 10.5 K/uL   RBC 4.77 3.87 - 5.11 MIL/uL   Hemoglobin 14.5 12.0 - 15.0 g/dL   HCT 45.7 36.0 - 46.0 %   MCV 95.8 80.0 - 100.0 fL   MCH 30.4 26.0 - 34.0 pg   MCHC 31.7 30.0 - 36.0 g/dL   RDW 13.3 11.5 - 15.5 %   Platelets 173 150 - 400 K/uL   nRBC 0.0 0.0 - 0.2 %   Neutrophils Relative % 62 %   Neutro Abs 5.6 1.7 - 7.7 K/uL   Lymphocytes  Relative 26 %   Lymphs Abs 2.3 0.7 - 4.0 K/uL   Monocytes Relative 10 %   Monocytes Absolute 0.9 0.1 - 1.0 K/uL   Eosinophils Relative 1 %   Eosinophils Absolute 0.0 0.0 - 0.5 K/uL   Basophils Relative 1 %   Basophils Absolute 0.0 0.0 - 0.1 K/uL   Immature Granulocytes 0 %   Abs Immature Granulocytes 0.02 0.00 - 0.07 K/uL    Comment: Performed at [facility], [place] [address], [place], [place] [address]  Acetaminophen level     Status: Abnormal   Collection Time: 02/20/21  5:19 PM  Result Value Ref Range   Acetaminophen (Tylenol), Serum <10 (L) 10 - 30 ug/mL    Comment: (NOTE) Therapeutic concentrations vary significantly. A range of 10-30 ug/mL  may be an effective concentration for many patients. However, some  are best treated at concentrations outside of this range. Acetaminophen concentrations >150 ug/mL at 4 hours after ingestion  and >50 ug/mL at 12 hours after ingestion are often associated with  toxic reactions.  Performed at [facility], [place] [address] [place], [place] [address]   Salicylate level     Status: Abnormal   Collection Time: 02/20/21  5:19 PM  Result Value Ref Range   Salicylate Lvl <2.9 (L) 7.0 - 30.0 mg/dL    Comment: Performed at [facility], [place] [address], [place], [place] [address]  Urine rapid drug screen (hosp performed)     Status: None   Collection Time: 02/20/21  8:43 PM  Result Value Ref Range   Opiates NONE DETECTED NONE DETECTED   Cocaine NONE DETECTED NONE DETECTED   Benzodiazepines NONE DETECTED NONE DETECTED   Amphetamines NONE DETECTED NONE DETECTED   Tetrahydrocannabinol NONE DETECTED NONE DETECTED   Barbiturates NONE DETECTED NONE DETECTED    Comment: (NOTE) DRUG SCREEN FOR MEDICAL PURPOSES ONLY.  IF CONFIRMATION IS NEEDED FOR ANY PURPOSE, NOTIFY LAB WITHIN 5 DAYS.  LOWEST DETECTABLE LIMITS FOR URINE DRUG SCREEN Drug Class                     Cutoff (ng/mL) Amphetamine  and metabolites    1000 Barbiturate and metabolites    200 Benzodiazepine  268 Tricyclics and metabolites     300 Opiates and metabolites        300 Cocaine and metabolites        300 THC                            50 Performed at [facility], [place] [address], [place], [place] [address]   Urinalysis, Routine w reflex microscopic Urine, Catheterized     Status: None   Collection Time: 02/20/21  8:43 PM  Result Value Ref Range   Color, Urine YELLOW YELLOW   APPearance CLEAR CLEAR   Specific Gravity, Urine 1.009 1.005 - 1.030   pH 5.0 5.0 - 8.0   Glucose, UA NEGATIVE NEGATIVE mg/dL   Hgb urine dipstick NEGATIVE NEGATIVE   Bilirubin Urine NEGATIVE NEGATIVE   Ketones, ur NEGATIVE NEGATIVE mg/dL   Protein, ur NEGATIVE NEGATIVE mg/dL   Nitrite NEGATIVE NEGATIVE   Leukocytes,Ua NEGATIVE NEGATIVE    Comment: Performed at [place] [address], [place], [place] [address]    Medications:  Current Facility-Administered Medications  Medication Dose Route Frequency Provider Last Rate Last Admin  . nicotine (NICODERM CQ - dosed in mg/24 hours) patch 21 mg  21 mg Transdermal Once McDonald, Mia A, PA-C      . QUEtiapine (SEROQUEL) tablet 25 mg  25 mg Oral QHS Emmaline Kluver, FNP      . sterile water (preservative free) injection           . ziprasidone (GEODON) injection 10 mg  10 mg Intramuscular Once Lacretia Leigh, MD       Current Outpatient Medications  Medication Sig Dispense Refill  . aspirin EC 81 MG tablet Take 81 mg by mouth daily as needed for moderate pain. Swallow whole.    . Biotin 5000 MCG TABS Take 1 tablet by mouth daily.    . cholecalciferol (VITAMIN D3) 25 MCG (1000 UNIT) tablet Take 1,000 Units by mouth daily.    . diphenhydramine-acetaminophen (TYLENOL PM) 25-500 MG TABS tablet Take 1 tablet by mouth at bedtime as needed (sleep).    . vitamin B-12 (CYANOCOBALAMIN) 100 MCG tablet Take 100 mcg by mouth daily.    .  Rivaroxaban 15 & 20 MG TBPK Take as directed on package: Start with one 15mg  tablet by mouth twice a day with food. On Day 22, switch to one 20mg  tablet once a day with food. (Patient not taking: No sig reported) 51 each 0    Musculoskeletal: Strength & Muscle Tone: within normal limits Gait & Station: unable to assess Patient leans: N/A  Psychiatric Specialty Exam: Physical Exam Vitals and nursing note reviewed.  Constitutional:      Appearance: She is well-developed.  HENT:     Head: Normocephalic.  Cardiovascular:     Rate and Rhythm: Normal rate.  Pulmonary:     Effort: Pulmonary effort is normal.  Neurological:     Mental Status: She is alert. She is disoriented.  Psychiatric:        Mood and Affect: Mood is anxious.        Speech: Speech is tangential.        Behavior: Behavior is slowed. Behavior is cooperative.        Thought Content: Thought content is paranoid and delusional.        Judgment: Judgment is inappropriate.     Review of Systems  Constitutional: Negative.  HENT: Negative.   Eyes: Negative.   Respiratory: Negative.   Cardiovascular: Negative.   Gastrointestinal: Negative.   Genitourinary: Negative.   Musculoskeletal: Negative.   Skin: Negative.   Neurological: Negative.   Psychiatric/Behavioral: Positive for confusion and hallucinations. The patient is nervous/anxious.     Blood pressure (!) 184/94, pulse 81, temperature 98.3 F (36.8 C), temperature source Oral, resp. rate 18, SpO2 100 %.There is no height or weight on file to calculate BMI.  General Appearance: Casual and Fairly Groomed  Eye Contact:  Fair  Speech:  Clear and Coherent  Volume:  Normal  Mood:  Anxious and Irritable  Affect:  Inappropriate  Thought Process:  Disorganized, Irrelevant and Descriptions of Associations: Loose  Orientation:  Other:  Self, time  Thought Content:  Delusions, Paranoid Ideation and Tangential  Suicidal Thoughts:  No  Homicidal Thoughts:  No  Memory:   Immediate;   Poor Recent;   Poor  Judgement:  Impaired  Insight:  Lacking  Psychomotor Activity:  Normal  Concentration:  Concentration: Fair and Attention Span: Fair  Recall:  Poor  Fund of Knowledge:  Good  Language:  Good  Akathisia:  No  Handed:  Right  AIMS (if indicated):     Assets:  Communication Skills Desire for Improvement Financial Resources/Insurance Housing Intimacy Social Support  ADL's:  Impaired  Cognition:  Impaired,  Mild  Sleep:        Treatment Plan Summary: Daily contact with patient to assess and evaluate symptoms and progress in treatment, Medication management and Plan Continue to recommend inpatient    Medication: -Seroquel 25 mg nightly/mood stability EKG obtained 03/04, QTc 461  Disposition: Recommend psychiatric Inpatient admission when medically cleared.  Suella [place], FNP 02/22/2021 12:15 PM

## 2021-02-22 NOTE — ED Notes (Signed)
Patient has been refusing vital signs but is currently being otherwise cooperative, will attempt vital signs later.

## 2021-02-22 NOTE — ED Notes (Addendum)
Patient standing outside of other patient's rooms staring at them.  Patient agreed to return to her room after multiple attempts at redirection.

## 2021-02-22 NOTE — ED Notes (Addendum)
Posey belt applied to patient

## 2021-02-22 NOTE — Progress Notes (Signed)
CSW contacted [facility] and spoke with Peter Congo who requested that referral be refaxed. CSW refaxed referrals to the following facilities for review:  [facility]   TTS will continue to seek bed placement.  Glennie Isle, MSW, [place], LCAS-A Phone: [phone] Disposition/TOC

## 2021-02-23 DIAGNOSIS — F0391 Unspecified dementia with behavioral disturbance: Secondary | ICD-10-CM

## 2021-02-23 DIAGNOSIS — F03918 Unspecified dementia, unspecified severity, with other behavioral disturbance: Secondary | ICD-10-CM | POA: Diagnosis present

## 2021-02-23 DIAGNOSIS — F22 Delusional disorders: Secondary | ICD-10-CM | POA: Diagnosis not present

## 2021-02-23 MED ORDER — ZIPRASIDONE MESYLATE 20 MG IM SOLR
10.0000 mg | Freq: Once | INTRAMUSCULAR | Status: DC
  Filled 2021-02-23: qty 20

## 2021-02-23 MED ORDER — STERILE WATER FOR INJECTION IJ SOLN
INTRAMUSCULAR | Status: AC
  Administered 2021-02-23: 10 mL
  Filled 2021-02-23: qty 10

## 2021-02-23 MED ORDER — STERILE WATER FOR INJECTION IJ SOLN
INTRAMUSCULAR | Status: AC
  Filled 2021-02-23: qty 10

## 2021-02-23 MED ORDER — ZIPRASIDONE MESYLATE 20 MG IM SOLR
10.0000 mg | Freq: Once | INTRAMUSCULAR | Status: AC
  Administered 2021-02-23: 10 mg via INTRAMUSCULAR
  Filled 2021-02-23: qty 20

## 2021-02-23 MED ORDER — LORAZEPAM 2 MG/ML IJ SOLN
2.0000 mg | Freq: Once | INTRAMUSCULAR | Status: AC
  Administered 2021-02-23: 2 mg via INTRAMUSCULAR
  Filled 2021-02-23: qty 1

## 2021-02-23 MED ORDER — ASENAPINE MALEATE 5 MG SL SUBL: 5.0000 mg | SUBLINGUAL_TABLET | Freq: Every day | SUBLINGUAL | Status: DC

## 2021-02-23 NOTE — Progress Notes (Signed)
Per Waylan Boga, NP, patient meets criteria for inpatient treatment. There are no appropriate beds at [facility] today. CSW re-faxed referrals to the following facilities for review:  [facility]   TTS will continue to seek bed placement.  Glennie Isle, MSW, [place], LCAS-A Phone: [phone] Disposition/TOC

## 2021-02-23 NOTE — ED Provider Notes (Signed)
4:29 PM--nursing request some EMTALA documentation for transfer.  At this time patient is resting comfortably, asleep.  Vital signs at 1656 are reassuring.  Plan transfer to psychiatric hospital for ongoing management.  Under IVC.   Daleen Bo, MD 02/23/21 [phone]

## 2021-02-23 NOTE — ED Notes (Signed)
Pt off unit to facility per provider. Pt alert, no s/s of distress. DC information and belongings given to sheriff for facility. Pt ambulatory . Pt used w/c off unit. Pt escorted and transported by sheriff.

## 2021-02-23 NOTE — ED Notes (Signed)
Patient still agitated and keep removing restraints even with posey belt. EDP informed. Soft bilateral wrist restraints ordered. Restraints applied. Will continue to monitor.

## 2021-02-23 NOTE — BH Assessment (Addendum)
Recommend continued inpatient. Received a call from [facility] with [facility], patient accepted to [facility] by Dr. Camie Patience for admission today. Nurse report [phone]. Nursing to arrange sheriff transport. [facility] nursing Eustaquio Maize, RN), provided updates. Nursing to arrange sheriff transport.

## 2021-02-23 NOTE — ED Notes (Signed)
Pt out of non violent restraints.   Pt resting comfortably.

## 2021-02-23 NOTE — ED Notes (Signed)
Pt confused, trying to leave,trying to move passed staff, no safe, difficult to redirect, agitated, trying to swing a staff, providers notified(see orders and MAR)

## 2021-02-23 NOTE — ED Provider Notes (Signed)
10:51 AM Called to bedside as patient still agitated try to get up out of bed and being combative.  Had her morning as needed Geodon dose but still not being cooperative.  She appears to be unsafe for herself and for staff.  Will give 20 of Geodon, 2 of Ativan and place her back in soft restraints.  Will have nurse reengage psychiatry about changing as needed agitation meds.   Lennice Sites, DO 02/23/21 1052

## 2021-02-23 NOTE — Consult Note (Signed)
[facility] Psych ED Progress  Note  Reason for Consult: Psychiatry provider reassessment  Referring Physician: Dr. Almyra Free Location of Patient: [facility] emergency department   Patient Identification: Raven Thompson MRN:  [id number] Principal Diagnosis: Psychosis in elderly with behavioral disturbance [facility]) Diagnosis:  Principal Problem:   Psychosis in elderly with behavioral disturbance ([place]) Active Problems:   Paranoia ([place])   Total Time spent with patient: 30 minutes  Subjective:   Raven Thompson is a 75 y.o. female patient admitted with hallucinations.  Patient remains unable to partcipate in psych evalaution due to disorientation, paranoia, and confusion. She remains paranoid refusing to tell me where she lives and who she lives with. She is unable to answer questions appropriately secondary to her confusion.   HPI:   02/23/21: Patient seen and evaluated by this provider in person. Upon presentation, patient agitated and refused to stay in her room requiring PRN agitation medications from nurse. Patient received a total of 20 mg of Geodon and 2 mg of Ativan around 0800 per care nurse. Patient still agitated on rounds around 1000, stating "we don't need to talk in my room." Patient reports "I'm good, I just need to get my hair done." When this provider went to round again around 1230 patient was requiring bilateral wrist restraints.   02/22/21 from NP Priscille Loveless: Raven Thompson is a 75 year old female who presents involuntary and unaccompanied to [facility]. Pt was a poor historian during the assessment. Clinician asked the pt, "what brought you to the hospital?" Pt reported, "my daughter had been listening to...." Pt then started talking something you put on your bladder. Clinician asked the pt again, "what brought you to the hospital?" Pt reported, because her daughter was busy at work. As clinician asked the pt questions, pt said, "who are you saying this too?" Clinician replied, "you," Pt then  appeared to fall asleep. Pt denied, SI, HI, AVH, self-injurious behaviors and access to weapons.   Past Psychiatric History: Delusional disorder  Risk to Self:   Denies Risk to Others:   Denies Prior Inpatient Therapy:   [facility] x2 Prior Outpatient Therapy:   Approx 2 years ago  Past Medical History:  Past Medical History:  Diagnosis Date  . CAD (coronary artery disease)   . COPD (chronic obstructive pulmonary disease) ([place])   . Hypertension   . MI (myocardial infarction) [facility])     Past Surgical History:  Procedure Laterality Date  . ABDOMINAL HYSTERECTOMY    . CORONARY ANGIOPLASTY WITH STENT PLACEMENT    . TONSILLECTOMY     Family History: History reviewed. No pertinent family history. Family Psychiatric  History: None reported Social History:  Social History   Substance and Sexual Activity  Alcohol Use No     Social History   Substance and Sexual Activity  Drug Use No    Social History   Socioeconomic History  . Marital status: Single    Spouse name: Not on file  . Number of children: Not on file  . Years of education: Not on file  . Highest education level: Not on file  Occupational History  . Not on file  Tobacco Use  . Smoking status: Current Some Day Smoker    Types: Cigarettes  . Smokeless tobacco: Never Used  Substance and Sexual Activity  . Alcohol use: No  . Drug use: No  . Sexual activity: Not Currently    Birth control/protection: Surgical  Other Topics Concern  .  Not on file  Social History Narrative  . Not on file   Social Determinants of Health   Financial Resource Strain: Not on file  Food Insecurity: Not on file  Transportation Needs: Not on file  Physical Activity: Not on file  Stress: Not on file  Social Connections: Not on file   Additional Social History:    Allergies:   Allergies  Allergen Reactions  . Sulfa Antibiotics Nausea Only    Labs:  No results found for this or any previous  visit (from the past 48 hour(s)).  Medications:  Current Facility-Administered Medications  Medication Dose Route Frequency Provider Last Rate Last Admin  . nicotine (NICODERM CQ - dosed in mg/24 hours) patch 21 mg  21 mg Transdermal Once McDonald, Mia A, PA-C      . QUEtiapine (SEROQUEL) tablet 25 mg  25 mg Oral QHS Emmaline Kluver, FNP      . sterile water (preservative free) injection           . ziprasidone (GEODON) injection 10 mg  10 mg Intramuscular Once Lennice Sites, DO       Current Outpatient Medications  Medication Sig Dispense Refill  . aspirin EC 81 MG tablet Take 81 mg by mouth daily as needed for moderate pain. Swallow whole.    . Biotin 5000 MCG TABS Take 1 tablet by mouth daily.    . cholecalciferol (VITAMIN D3) 25 MCG (1000 UNIT) tablet Take 1,000 Units by mouth daily.    . diphenhydramine-acetaminophen (TYLENOL PM) 25-500 MG TABS tablet Take 1 tablet by mouth at bedtime as needed (sleep).    . vitamin B-12 (CYANOCOBALAMIN) 100 MCG tablet Take 100 mcg by mouth daily.    . Rivaroxaban 15 & 20 MG TBPK Take as directed on package: Start with one 15mg  tablet by mouth twice a day with food. On Day 22, switch to one 20mg  tablet once a day with food. (Patient not taking: No sig reported) 51 each 0    Musculoskeletal: Strength & Muscle Tone: within normal limits Gait & Station: unable to assess Patient leans: N/A  Psychiatric Specialty Exam: Physical Exam Vitals and nursing note reviewed.  Constitutional:      Appearance: She is well-developed.  HENT:     Head: Normocephalic.  Cardiovascular:     Rate and Rhythm: Normal rate.  Pulmonary:     Effort: Pulmonary effort is normal.  Musculoskeletal:        General: Normal range of motion.  Neurological:     Mental Status: She is alert. She is disoriented.  Psychiatric:        Mood and Affect: Mood is anxious. Affect is angry.        Speech: Speech is tangential.        Behavior: Behavior is uncooperative and agitated.         Thought Content: Thought content is paranoid and delusional.        Judgment: Judgment is inappropriate.     Review of Systems  Constitutional: Negative.   HENT: Negative.   Eyes: Negative.   Respiratory: Negative.   Cardiovascular: Negative.   Gastrointestinal: Negative.   Genitourinary: Negative.   Musculoskeletal: Negative.   Skin: Negative.   Neurological: Negative.   Psychiatric/Behavioral: Positive for behavioral problems and confusion. The patient is nervous/anxious.     Blood pressure (!) 176/86, pulse 80, temperature 98.4 F (36.9 C), temperature source Oral, resp. rate 18, SpO2 98 %.There is no height or weight on file  to calculate BMI.  General Appearance: Casual and Fairly Groomed  Eye Contact:  Fair  Speech:  Clear and Coherent  Volume:  Normal  Mood:  Anxious and Irritable  Affect:  Inappropriate  Thought Process:  Disorganized, Irrelevant and Descriptions of Associations: Loose  Orientation:  Other:  Self, time  Thought Content:  Delusions, Paranoid Ideation and Tangential  Suicidal Thoughts:  UTA d/t mentation/agitation  Homicidal Thoughts:  UTA d/t mentation/agitation  Memory:  Immediate;   Poor Recent;   Poor  Judgement:  Impaired  Insight:  Lacking  Psychomotor Activity:  Normal  Concentration:  Concentration: Fair and Attention Span: Fair  Recall:  Poor  Fund of Knowledge:  Good  Language:  Good  Akathisia:  No  Handed:  Right  AIMS (if indicated):     Assets:  Communication Skills Desire for Improvement Financial Resources/Insurance Housing Intimacy Social Support  ADL's:  Impaired  Cognition:  Impaired,  Moderate  Sleep:   poor     Treatment Plan Summary: Daily contact with patient to assess and evaluate symptoms and progress in treatment, Medication management and Plan Continue to recommend inpatient    Medication: Psychosis in the elderly with behavioral disturbances: -Start Asenapine 5 mg daily at Bedtime, discontinue Seroquel  25 mg at bedtime as she did not sleep last night according to the RN --continue de-escalation for agitation and confusion    Disposition: Recommend psychiatric Inpatient admission when medically cleared.  Waylan Boga, NP 02/23/2021 1:59 PM

## 2021-10-21 ENCOUNTER — Emergency Department (HOSPITAL_COMMUNITY): Payer: Medicare Other

## 2021-10-21 ENCOUNTER — Other Ambulatory Visit: Payer: Self-pay

## 2021-10-21 ENCOUNTER — Inpatient Hospital Stay (HOSPITAL_COMMUNITY)
Admission: EM | Admit: 2021-10-21 | Discharge: 2021-10-26 | DRG: 391 | Disposition: A | Discharge status: Nursing Home (Includes ICF) | Payer: Medicare Other | Source: Skilled Nursing Facility | Attending: Internal Medicine | Admitting: Internal Medicine

## 2021-10-21 DIAGNOSIS — Z90710 Acquired absence of both cervix and uterus: Secondary | ICD-10-CM

## 2021-10-21 DIAGNOSIS — J449 Chronic obstructive pulmonary disease, unspecified: Secondary | ICD-10-CM | POA: Diagnosis present

## 2021-10-21 DIAGNOSIS — N3000 Acute cystitis without hematuria: Secondary | ICD-10-CM

## 2021-10-21 DIAGNOSIS — Z882 Allergy status to sulfonamides status: Secondary | ICD-10-CM

## 2021-10-21 DIAGNOSIS — I252 Old myocardial infarction: Secondary | ICD-10-CM

## 2021-10-21 DIAGNOSIS — Z955 Presence of coronary angioplasty implant and graft: Secondary | ICD-10-CM

## 2021-10-21 DIAGNOSIS — I10 Essential (primary) hypertension: Secondary | ICD-10-CM | POA: Diagnosis present

## 2021-10-21 DIAGNOSIS — Z79899 Other long term (current) drug therapy: Secondary | ICD-10-CM

## 2021-10-21 DIAGNOSIS — K5792 Diverticulitis of intestine, part unspecified, without perforation or abscess without bleeding: Secondary | ICD-10-CM | POA: Diagnosis present

## 2021-10-21 DIAGNOSIS — Z20822 Contact with and (suspected) exposure to covid-19: Secondary | ICD-10-CM | POA: Diagnosis present

## 2021-10-21 DIAGNOSIS — K5732 Diverticulitis of large intestine without perforation or abscess without bleeding: Principal | ICD-10-CM | POA: Diagnosis present

## 2021-10-21 DIAGNOSIS — C4362 Malignant melanoma of left upper limb, including shoulder: Secondary | ICD-10-CM | POA: Diagnosis present

## 2021-10-21 DIAGNOSIS — C799 Secondary malignant neoplasm of unspecified site: Secondary | ICD-10-CM | POA: Diagnosis present

## 2021-10-21 DIAGNOSIS — E669 Obesity, unspecified: Secondary | ICD-10-CM | POA: Diagnosis present

## 2021-10-21 DIAGNOSIS — E538 Deficiency of other specified B group vitamins: Secondary | ICD-10-CM | POA: Diagnosis present

## 2021-10-21 DIAGNOSIS — N390 Urinary tract infection, site not specified: Secondary | ICD-10-CM

## 2021-10-21 DIAGNOSIS — Z87891 Personal history of nicotine dependence: Secondary | ICD-10-CM

## 2021-10-21 DIAGNOSIS — I2510 Atherosclerotic heart disease of native coronary artery without angina pectoris: Secondary | ICD-10-CM | POA: Diagnosis present

## 2021-10-21 DIAGNOSIS — R6510 Systemic inflammatory response syndrome (SIRS) of non-infectious origin without acute organ dysfunction: Secondary | ICD-10-CM

## 2021-10-21 DIAGNOSIS — G9341 Metabolic encephalopathy: Secondary | ICD-10-CM

## 2021-10-21 DIAGNOSIS — Z86711 Personal history of pulmonary embolism: Secondary | ICD-10-CM

## 2021-10-21 DIAGNOSIS — F0390 Unspecified dementia without behavioral disturbance: Secondary | ICD-10-CM | POA: Diagnosis present

## 2021-10-21 DIAGNOSIS — Z66 Do not resuscitate: Secondary | ICD-10-CM | POA: Diagnosis present

## 2021-10-21 DIAGNOSIS — Z6833 Body mass index (BMI) 33.0-33.9, adult: Secondary | ICD-10-CM

## 2021-10-21 LAB — CBC WITH DIFFERENTIAL/PLATELET
Abs Immature Granulocytes: 0.04 10*3/uL (ref 0.00–0.07)
Basophils Absolute: 0 10*3/uL (ref 0.0–0.1)
Basophils Relative: 0 %
Eosinophils Absolute: 0 10*3/uL (ref 0.0–0.5)
Eosinophils Relative: 0 %
HCT: 40.4 % (ref 36.0–46.0)
Hemoglobin: 13.2 g/dL (ref 12.0–15.0)
Immature Granulocytes: 0 %
Lymphocytes Relative: 8 %
Lymphs Abs: 0.7 10*3/uL (ref 0.7–4.0)
MCH: 28.9 pg (ref 26.0–34.0)
MCHC: 32.7 g/dL (ref 30.0–36.0)
MCV: 88.6 fL (ref 80.0–100.0)
Monocytes Absolute: 0.6 10*3/uL (ref 0.1–1.0)
Monocytes Relative: 7 %
Neutro Abs: 7.5 10*3/uL (ref 1.7–7.7)
Neutrophils Relative %: 85 %
Platelets: 198 10*3/uL (ref 150–400)
RBC: 4.56 MIL/uL (ref 3.87–5.11)
RDW: 13.5 % (ref 11.5–15.5)
WBC: 8.9 10*3/uL (ref 4.0–10.5)
nRBC: 0 % (ref 0.0–0.2)

## 2021-10-21 LAB — COMPREHENSIVE METABOLIC PANEL WITH GFR
ALT: 30 U/L (ref 0–44)
AST: 31 U/L (ref 15–41)
Albumin: 3.2 g/dL — ABNORMAL LOW (ref 3.5–5.0)
Alkaline Phosphatase: 85 U/L (ref 38–126)
Anion gap: 8 (ref 5–15)
BUN: 14 mg/dL (ref 8–23)
CO2: 22 mmol/L (ref 22–32)
Calcium: 9 mg/dL (ref 8.9–10.3)
Chloride: 103 mmol/L (ref 98–111)
Creatinine, Ser: 0.83 mg/dL (ref 0.44–1.00)
GFR, Estimated: >60 mL/min
Glucose, Bld: 128 mg/dL — ABNORMAL HIGH (ref 70–99)
Potassium: 3.8 mmol/L (ref 3.5–5.1)
Sodium: 133 mmol/L — ABNORMAL LOW (ref 135–145)
Total Bilirubin: 0.4 mg/dL (ref 0.3–1.2)
Total Protein: 7.1 g/dL (ref 6.5–8.1)

## 2021-10-21 LAB — URINALYSIS, ROUTINE W REFLEX MICROSCOPIC
Bilirubin Urine: NEGATIVE
Glucose, UA: NEGATIVE mg/dL
Hgb urine dipstick: NEGATIVE
Ketones, ur: NEGATIVE mg/dL
Nitrite: NEGATIVE
Protein, ur: NEGATIVE mg/dL
Specific Gravity, Urine: >1.046 — ABNORMAL HIGH (ref 1.005–1.030)
pH: 6 (ref 5.0–8.0)

## 2021-10-21 LAB — LACTIC ACID, PLASMA: Lactic Acid, Venous: 1.6 mmol/L (ref 0.5–1.9)

## 2021-10-21 LAB — LIPASE, BLOOD: Lipase: 25 U/L (ref 11–51)

## 2021-10-21 MED ORDER — ACETAMINOPHEN 325 MG PO TABS
650.0000 mg | ORAL_TABLET | Freq: Once | ORAL | Status: AC
  Administered 2021-10-21: 650 mg via ORAL
  Filled 2021-10-21: qty 2

## 2021-10-21 MED ORDER — IOHEXOL 300 MG/ML  SOLN
100.0000 mL | Freq: Once | INTRAMUSCULAR | Status: AC | PRN
  Administered 2021-10-21: 100 mL via INTRAVENOUS

## 2021-10-21 NOTE — ED Provider Notes (Signed)
Emergency Medicine Provider Triage Evaluation Note  Raven Thompson , a 75 y.o. female  was evaluated in triage.  Pt complains of altered mental status.  Daughter states that she has been declining significantly over the last several days.  Coming from facility.  Has a history of melanoma.  Has also been complaining of abdominal pain over the last few days.  Review of Systems  Positive:  Negative: See above   Physical Exam  BP 120/84 (BP Location: Right Arm)   Pulse 87   Temp (!) 100.5 F (38.1 C) (Oral)   Resp 19   SpO2 92%  Gen:   Awake, no distress   Resp:  Normal effort  MSK:   Moves extremities without difficulty  Other:  Diffuse abdominal tenderness.   Medical Decision Making  Medically screening exam initiated at 3:01 PM.  Appropriate orders placed.  Raven Thompson was informed that the remainder of the evaluation will be completed by another provider, this initial triage assessment does not replace that evaluation, and the importance of remaining in the ED until their evaluation is complete.     Raven Bright [place], PA-C 10/21/21 1502    Truddie Hidden, MD 10/21/21 [phone]

## 2021-10-21 NOTE — ED Triage Notes (Signed)
Pt POV from [facility] with family for multiple complaints. Family reports pt had chest pain on Sunday which has resolved, AMS (LSN Sunday), lower abdominal pain. Denies n/v/d. Facility called family today to say that the pt's heart rate was 170 today. They wanted to check for a UTI but have not done that yet.

## 2021-10-22 ENCOUNTER — Emergency Department (HOSPITAL_COMMUNITY): Payer: Medicare Other

## 2021-10-22 ENCOUNTER — Encounter (HOSPITAL_COMMUNITY): Payer: Self-pay | Admitting: Emergency Medicine

## 2021-10-22 DIAGNOSIS — K5792 Diverticulitis of intestine, part unspecified, without perforation or abscess without bleeding: Secondary | ICD-10-CM | POA: Diagnosis not present

## 2021-10-22 DIAGNOSIS — Z882 Allergy status to sulfonamides status: Secondary | ICD-10-CM | POA: Diagnosis not present

## 2021-10-22 DIAGNOSIS — Z87891 Personal history of nicotine dependence: Secondary | ICD-10-CM | POA: Diagnosis not present

## 2021-10-22 DIAGNOSIS — I10 Essential (primary) hypertension: Secondary | ICD-10-CM | POA: Diagnosis present

## 2021-10-22 DIAGNOSIS — G9341 Metabolic encephalopathy: Secondary | ICD-10-CM

## 2021-10-22 DIAGNOSIS — K5732 Diverticulitis of large intestine without perforation or abscess without bleeding: Secondary | ICD-10-CM | POA: Diagnosis present

## 2021-10-22 DIAGNOSIS — Z90710 Acquired absence of both cervix and uterus: Secondary | ICD-10-CM | POA: Diagnosis not present

## 2021-10-22 DIAGNOSIS — C4362 Malignant melanoma of left upper limb, including shoulder: Secondary | ICD-10-CM | POA: Diagnosis present

## 2021-10-22 DIAGNOSIS — F0390 Unspecified dementia without behavioral disturbance: Secondary | ICD-10-CM | POA: Diagnosis present

## 2021-10-22 DIAGNOSIS — N390 Urinary tract infection, site not specified: Secondary | ICD-10-CM | POA: Diagnosis present

## 2021-10-22 DIAGNOSIS — E669 Obesity, unspecified: Secondary | ICD-10-CM | POA: Diagnosis present

## 2021-10-22 DIAGNOSIS — Z79899 Other long term (current) drug therapy: Secondary | ICD-10-CM | POA: Diagnosis not present

## 2021-10-22 DIAGNOSIS — I252 Old myocardial infarction: Secondary | ICD-10-CM | POA: Diagnosis not present

## 2021-10-22 DIAGNOSIS — Z86711 Personal history of pulmonary embolism: Secondary | ICD-10-CM | POA: Diagnosis not present

## 2021-10-22 DIAGNOSIS — Z20822 Contact with and (suspected) exposure to covid-19: Secondary | ICD-10-CM | POA: Diagnosis present

## 2021-10-22 DIAGNOSIS — Z6833 Body mass index (BMI) 33.0-33.9, adult: Secondary | ICD-10-CM | POA: Diagnosis not present

## 2021-10-22 DIAGNOSIS — I2510 Atherosclerotic heart disease of native coronary artery without angina pectoris: Secondary | ICD-10-CM | POA: Diagnosis present

## 2021-10-22 DIAGNOSIS — E538 Deficiency of other specified B group vitamins: Secondary | ICD-10-CM | POA: Diagnosis present

## 2021-10-22 DIAGNOSIS — C799 Secondary malignant neoplasm of unspecified site: Secondary | ICD-10-CM | POA: Diagnosis present

## 2021-10-22 DIAGNOSIS — Z66 Do not resuscitate: Secondary | ICD-10-CM | POA: Diagnosis present

## 2021-10-22 DIAGNOSIS — J449 Chronic obstructive pulmonary disease, unspecified: Secondary | ICD-10-CM | POA: Diagnosis present

## 2021-10-22 DIAGNOSIS — Z955 Presence of coronary angioplasty implant and graft: Secondary | ICD-10-CM | POA: Diagnosis not present

## 2021-10-22 LAB — PROTIME-INR
INR: 1.2 (ref 0.8–1.2)
Prothrombin Time: 15.3 seconds — ABNORMAL HIGH (ref 11.4–15.2)

## 2021-10-22 LAB — APTT: aPTT: 31 seconds (ref 24–36)

## 2021-10-22 LAB — RESP PANEL BY RT-PCR (FLU A&B, COVID) ARPGX2
Influenza A by PCR: NEGATIVE
Influenza B by PCR: NEGATIVE
SARS Coronavirus 2 by RT PCR: NEGATIVE

## 2021-10-22 LAB — URINE CULTURE
Culture: <10000 — AB
Special Requests: NORMAL

## 2021-10-22 LAB — C-REACTIVE PROTEIN: CRP: 12.5 mg/dL — ABNORMAL HIGH (ref <1.0)

## 2021-10-22 MED ORDER — PIPERACILLIN-TAZOBACTAM 3.375 G IVPB
3.3750 g | Freq: Three times a day (TID) | INTRAVENOUS | Status: DC
  Administered 2021-10-22 – 2021-10-26 (×11): 3.375 g via INTRAVENOUS
  Filled 2021-10-22 (×12): qty 50

## 2021-10-22 MED ORDER — ENOXAPARIN SODIUM 40 MG/0.4ML IJ SOSY
40.0000 mg | PREFILLED_SYRINGE | INTRAMUSCULAR | Status: DC
  Administered 2021-10-22 – 2021-10-25 (×4): 40 mg via SUBCUTANEOUS
  Filled 2021-10-22 (×5): qty 0.4

## 2021-10-22 MED ORDER — LACTATED RINGERS IV BOLUS (SEPSIS)
250.0000 mL | Freq: Once | INTRAVENOUS | Status: AC
  Administered 2021-10-22: 250 mL via INTRAVENOUS

## 2021-10-22 MED ORDER — CYANOCOBALAMIN 500 MCG PO TABS
500.0000 ug | ORAL_TABLET | Freq: Every day | ORAL | Status: DC
  Administered 2021-10-22 – 2021-10-25 (×3): 500 ug via ORAL
  Filled 2021-10-22 (×5): qty 1

## 2021-10-22 MED ORDER — LACTATED RINGERS IV BOLUS (SEPSIS)
1000.0000 mL | Freq: Once | INTRAVENOUS | Status: AC
  Administered 2021-10-22: 1000 mL via INTRAVENOUS

## 2021-10-22 MED ORDER — DOCUSATE SODIUM 100 MG PO CAPS
100.0000 mg | ORAL_CAPSULE | Freq: Two times a day (BID) | ORAL | Status: DC
  Administered 2021-10-22 – 2021-10-25 (×8): 100 mg via ORAL
  Filled 2021-10-22 (×9): qty 1

## 2021-10-22 MED ORDER — LACTATED RINGERS IV SOLN: INTRAVENOUS | Status: AC

## 2021-10-22 MED ORDER — ACETAMINOPHEN 325 MG PO TABS
650.0000 mg | ORAL_TABLET | Freq: Four times a day (QID) | ORAL | Status: DC | PRN
  Administered 2021-10-22 – 2021-10-25 (×5): 650 mg via ORAL
  Filled 2021-10-22 (×5): qty 2

## 2021-10-22 MED ORDER — PANTOPRAZOLE SODIUM 40 MG PO TBEC
40.0000 mg | DELAYED_RELEASE_TABLET | Freq: Every day | ORAL | Status: DC
  Administered 2021-10-22 – 2021-10-26 (×5): 40 mg via ORAL
  Filled 2021-10-22 (×5): qty 1

## 2021-10-22 MED ORDER — VITAMIN D 25 MCG (1000 UNIT) PO TABS
1000.0000 [IU] | ORAL_TABLET | Freq: Every day | ORAL | Status: DC
  Administered 2021-10-22 – 2021-10-25 (×4): 1000 [IU] via ORAL
  Filled 2021-10-22 (×5): qty 1

## 2021-10-22 MED ORDER — LACTATED RINGERS IV SOLN: INTRAVENOUS | Status: DC

## 2021-10-22 MED ORDER — ACETAMINOPHEN 650 MG RE SUPP: 650.0000 mg | Freq: Four times a day (QID) | RECTAL | Status: DC | PRN

## 2021-10-22 MED ORDER — PIPERACILLIN-TAZOBACTAM 3.375 G IVPB 30 MIN
3.3750 g | Freq: Once | INTRAVENOUS | Status: AC
  Administered 2021-10-22: 3.375 g via INTRAVENOUS
  Filled 2021-10-22: qty 50

## 2021-10-22 NOTE — Evaluation (Addendum)
Occupational Therapy Evaluation Patient Details Name: Raven Thompson MRN: [id number] DOB: 1946-05-18 Today's Date: 10/22/2021   History of Present Illness Pt is a 75 y/o female admitted 11/1 secondary to AMS. Found to have acute diverticulitis. PMH includes CAD, COPD, HTN, and PE.   Clinical Impression   Patient admitted for the diagnosis above.  Per patient and daughter, she lives at a local ALF, is generally independent with mobility and toileting, but did need assist with lower body ADL via staff.  Deficits impacting independence are listed below.  Currently she is more confused than baseline per the daughter, is needing up to [place] for basic mobility and up to Mod A for upper body ADL.  OT to continue efforts in the acute setting, recommend return to ALF with [facility] services to ensure a safe transition.       Recommendations for follow up therapy are one component of a multi-disciplinary discharge planning process, led by the attending physician.  Recommendations may be updated based on patient status, additional functional criteria and insurance authorization.   Follow Up Recommendations  Home health OT    Assistance Recommended at Discharge Intermittent Supervision/Assistance  Functional Status Assessment  Patient has had a recent decline in their functional status and demonstrates the ability to make significant improvements in function in a reasonable and predictable amount of time.  Equipment Recommendations       Recommendations for Other Services       Precautions / Restrictions Precautions Precautions: Fall Restrictions Weight Bearing Restrictions: No      Mobility Bed Mobility Overal bed mobility: Needs Assistance Bed Mobility: Supine to Sit;Sit to Supine     Supine to sit: Min assist Sit to supine: Min assist        Transfers Overall transfer level: Needs assistance Equipment used: 1 person hand held assist Transfers: Sit to/from [employer] Sit to Stand: Min guard Stand pivot transfers: Min guard         General transfer comment: HHA with no dizziness noted      Balance Overall balance assessment: Needs assistance Sitting-balance support: No upper extremity supported;Feet unsupported Sitting balance-Leahy Scale: Fair     Standing balance support: Single extremity supported;During functional activity Standing balance-Leahy Scale: Poor Standing balance comment: Reliant on UE support                           ADL either performed or assessed with clinical judgement   ADL       Grooming: Wash/dry hands;Wash/dry face;Set up;Bed level   Upper Body Bathing: Moderate assistance;Bed level       Upper Body Dressing : Moderate assistance;Bed level       Toilet Transfer: Min guard;Ambulation;BSC   Toileting- Water quality scientist and Hygiene: Min guard               Vision Patient Visual Report: No change from baseline       Perception Perception Perception: Not tested   Praxis Praxis Praxis: Not tested    Pertinent Vitals/Pain Pain Assessment: Faces Faces Pain Scale: Hurts little more Pain Location: low back Pain Descriptors / Indicators: Aching Pain Intervention(s): Monitored during session     Hand Dominance Right   Extremity/Trunk Assessment Upper Extremity Assessment Upper Extremity Assessment: LUE deficits/detail;Generalized weakness LUE: Shoulder pain with ROM LUE Sensation: WNL LUE Coordination: WNL   Lower Extremity Assessment Lower Extremity Assessment: Defer to PT evaluation   Cervical / Trunk  Assessment Cervical / Trunk Assessment: Normal   Communication Communication Communication: No difficulties   Cognition Arousal/Alertness: Awake/alert Behavior During Therapy: WFL for tasks assessed/performed Overall Cognitive Status: Impaired/Different from baseline Area of Impairment: Orientation;Memory;Awareness;Problem solving                  Orientation Level: Disoriented to;Situation;Time   Memory: Decreased short-term memory     Awareness: Emergent Problem Solving: Slow processing                        Home Living Family/patient expects to be discharged to:: Assisted living                             Home Equipment: Shower seat;Grab bars - tub/shower          Prior Functioning/Environment Prior Level of Function : Needs assist       Physical Assist : ADLs (physical)   ADLs (physical): Bathing;Dressing Mobility Comments: Pt reports independence with ambulation ADLs Comments: Daughter reports she can normally perform toileting tasks, but requires assist for bathing/dressing.  Up to Min A for upper body dressing due L shoulder pain.        OT Problem List: Decreased strength;Decreased range of motion;Decreased activity tolerance;Impaired balance (sitting and/or standing);Decreased safety awareness;Decreased cognition      OT Treatment/Interventions: Self-care/ADL training;Therapeutic exercise;DME and/or AE instruction;Cognitive remediation/compensation;Therapeutic activities;Balance training    OT Goals(Current goals can be found in the care plan section) Acute Rehab OT Goals Patient Stated Goal: To feel better and go back to my apartment OT Goal Formulation: With patient Time For Goal Achievement: 11/05/21 Potential to Achieve Goals: Good ADL Goals Pt Will Perform Grooming: with supervision;sitting Pt Will Perform Upper Body Bathing: with supervision;sitting Pt Will Perform Upper Body Dressing: with supervision;sitting Pt Will Transfer to Toilet: with supervision;regular height toilet;ambulating Pt Will Perform Toileting - Clothing Manipulation and hygiene: with supervision;sit to/from stand  OT Frequency: Min 2X/week   Barriers to D/C:    none noted       Co-evaluation              AM-PAC OT "6 Clicks" Daily Activity     Outcome Measure Help from another person  eating meals?: None Help from another person taking care of personal grooming?: None Help from another person toileting, which includes using toliet, bedpan, or urinal?: A Little Help from another person bathing (including washing, rinsing, drying)?: A Little Help from another person to put on and taking off regular upper body clothing?: A Little Help from another person to put on and taking off regular lower body clothing?: A Lot 6 Click Score: 19   End of Session Equipment Utilized During Treatment: Gait belt  Activity Tolerance: Patient tolerated treatment well Patient left: in bed;with call bell/phone within reach;with family/visitor present  OT Visit Diagnosis: Unsteadiness on feet (R26.81);Muscle weakness (generalized) (M62.81)                Time: 7371-0626 OT Time Calculation (min): 19 min Charges:  OT General Charges $OT Visit: 1 Visit OT Evaluation $OT Eval Moderate Complexity: 1 Mod  10/22/2021  RP, OTR/L  Acute Rehabilitation Services  Office:  [phone]   Metta Clines 10/22/2021, 3:30 PM

## 2021-10-22 NOTE — Evaluation (Signed)
Physical Therapy Evaluation Patient Details Name: Raven Thompson MRN: [id number] DOB: 1946/10/18 Today's Date: 10/22/2021  History of Present Illness  Pt is a 75 y/o female admitted 11/1 secondary to AMS. Found to have acute diverticulitis. PMH includes CAD, COPD, HTN, and PE.  Clinical Impression  Pt admitted secondary to problem above with deficits below. Pt requiring min A for bed mobility and to stand at EOB. Once standing, pt reporting increased dizziness and wooziness, so returned to sitting. Anticipate pt will progress well once symptoms improves. Lives at ALF and normally independent with mobility. Feel she would likely benefit from use of RW. Recommending HHPT follow up at ALF to address current deficits. Will continue to follow acutely.        Recommendations for follow up therapy are one component of a multi-disciplinary discharge planning process, led by the attending physician.  Recommendations may be updated based on patient status, additional functional criteria and insurance authorization.  Follow Up Recommendations Home health PT (PT follow up at ALF)    Assistance Recommended at Discharge Intermittent Supervision/Assistance  Functional Status Assessment Patient has had a recent decline in their functional status and demonstrates the ability to make significant improvements in function in a reasonable and predictable amount of time.  Equipment Recommendations  Rolling walker (2 wheels)    Recommendations for Other Services       Precautions / Restrictions Precautions Precautions: Fall Restrictions Weight Bearing Restrictions: No      Mobility  Bed Mobility Overal bed mobility: Needs Assistance Bed Mobility: Supine to Sit;Sit to Supine     Supine to sit: Min assist Sit to supine: Min assist   General bed mobility comments: required assist for trunk elevation to come to sitting and assist for LE assist to return to supine.    Transfers Overall transfer level:  Needs assistance Equipment used: 1 person hand held assist Transfers: Sit to/from Stand Sit to Stand: Min assist           General transfer comment: Min A for steadying to stand from higher stretcher. Pt became woozy and dizzy in standing and had to return to sitting and then to supine.    Ambulation/Gait                Stairs            Wheelchair Mobility    Modified Rankin (Stroke Patients Only)       Balance Overall balance assessment: Needs assistance Sitting-balance support: No upper extremity supported;Feet supported Sitting balance-Leahy Scale: Good     Standing balance support: Single extremity supported;During functional activity Standing balance-Leahy Scale: Poor Standing balance comment: Reliant on UE support                             Pertinent Vitals/Pain Pain Assessment: No/denies pain    Home Living Family/patient expects to be discharged to:: Assisted living                 Home Equipment: Shower seat      Prior Function Prior Level of Function : Needs assist       Physical Assist : ADLs (physical)   ADLs (physical): Bathing;Dressing Mobility Comments: Pt reports independence with ambulation ADLs Comments: Daughter reports she can normally perform toileting tasks, but requires assist for bathing/dressing.     Hand Dominance   Dominant Hand: Right    Extremity/Trunk Assessment   Upper Extremity Assessment Upper  Extremity Assessment: Defer to OT evaluation    Lower Extremity Assessment Lower Extremity Assessment: Generalized weakness    Cervical / Trunk Assessment Cervical / Trunk Assessment: Normal  Communication   Communication: No difficulties  Cognition Arousal/Alertness: Awake/alert Behavior During Therapy: WFL for tasks assessed/performed Overall Cognitive Status: Impaired/Different from baseline Area of Impairment: Orientation;Memory;Awareness;Problem solving                  Orientation Level: Disoriented to;Situation;Time   Memory: Decreased short-term memory     Awareness: Emergent Problem Solving: Slow processing          General Comments General comments (skin integrity, edema, etc.): Pt's daughter present at end of session and confirmed PLOF info    Exercises     Assessment/Plan    PT Assessment Patient needs continued PT services  PT Problem List Decreased strength;Decreased balance;Decreased activity tolerance;Decreased mobility;Decreased knowledge of use of DME;Decreased cognition;Decreased safety awareness;Decreased knowledge of precautions       PT Treatment Interventions DME instruction;Gait training;Functional mobility training;Therapeutic activities;Balance training;Therapeutic exercise;Patient/family education;Cognitive remediation    PT Goals (Current goals can be found in the Care Plan section)  Acute Rehab PT Goals Patient Stated Goal: to be independent PT Goal Formulation: With patient Time For Goal Achievement: 11/05/21 Potential to Achieve Goals: Good    Frequency Min 3X/week   Barriers to discharge        Co-evaluation               AM-PAC PT "6 Clicks" Mobility  Outcome Measure Help needed turning from your back to your side while in a flat bed without using bedrails?: A Little Help needed moving from lying on your back to sitting on the side of a flat bed without using bedrails?: A Little Help needed moving to and from a bed to a chair (including a wheelchair)?: A Little Help needed standing up from a chair using your arms (e.g., wheelchair or bedside chair)?: A Little Help needed to walk in hospital room?: A Lot Help needed climbing 3-5 steps with a railing? : Total 6 Click Score: 15    End of Session Equipment Utilized During Treatment: Gait belt Activity Tolerance: Treatment limited secondary to medical complications (Comment) (dizziness/wooziness) Patient left: in bed;with call bell/phone within  reach;with family/visitor present (on stretcher in ED) Nurse Communication: Mobility status PT Visit Diagnosis: Unsteadiness on feet (R26.81);Muscle weakness (generalized) (M62.81);Difficulty in walking, not elsewhere classified (R26.2)    Time: 5631-4970 PT Time Calculation (min) (ACUTE ONLY): 16 min   Charges:   PT Evaluation $PT Eval Moderate Complexity: 1 Mod          Reuel Derby, PT, DPT  Acute Rehabilitation Services  Pager: [phone] Office: [phone]   Rudean Hitt 10/22/2021, 10:20 AM

## 2021-10-22 NOTE — Sepsis Progress Note (Signed)
Following for sepsis monitoring ?

## 2021-10-22 NOTE — ED Notes (Signed)
Pt in bed, daughter at bedside, pt awake and oriented to person, re oriented pt to place and day of the week, pt on and off bed pan, pt denies pain, abd soft with bowel sounds, distended, mildly tender to palpation.  Pt has no needs at this time.

## 2021-10-22 NOTE — ED Notes (Signed)
Lynnea Maizes Daughter [phone] requesting an update on the patient

## 2021-10-22 NOTE — ED Provider Notes (Signed)
[facility] EMERGENCY DEPARTMENT Provider Note   CSN: [id number] Arrival date & time: 10/21/21  1318     History No chief complaint on file.   Raven Thompson is a 75 y.o. female.  The history is provided by a relative and medical records. The history is limited by the condition of the patient (level 5 dementia).  Illness Location:  At assisted living Quality:  AMS Severity:  Moderate Onset quality:  Gradual Duration:  2 days Timing:  Constant Progression:  Worsening Chronicity:  New Context:  Family thinks she has a UTI and patient has stage 3b melanoma Relieved by:  Nothing Worsened by:  Nothing Ineffective treatments:  None Associated symptoms: abdominal pain and fever   Associated symptoms: no cough, no rash and no vomiting   Risk factors:  Dementia Patient with stage 3B melanoma presents with AMS from Assisted living.  Fevers at home and low abdominal pain.      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   Acute head injury 08/12/2018   Pulmonary embolism ([place]) 08/12/2016   CAD (coronary artery disease) 08/05/2016   Essential hypertension 08/05/2016   Mild cognitive disorder 06/25/2016   Prolonged Q-T interval on ECG 06/23/2016   Vitamin B12 deficiency 06/23/2016   Congestion of nasal sinus 05/29/2016   Tick bite 05/29/2016   Paranoia ([place]) 05/28/2016   Abnormal mammogram of right breast 12/19/2015   Cataract 06/08/2014    Past Surgical History:  Procedure Laterality Date   ABDOMINAL HYSTERECTOMY     CORONARY ANGIOPLASTY WITH STENT PLACEMENT     TONSILLECTOMY       OB History   No obstetric history on file.     History reviewed. No pertinent family history.  Social History    Tobacco Use   Smoking status: Some Days    Types: Cigarettes   Smokeless tobacco: Never  Substance Use Topics   Alcohol use: No   Drug use: No    Home Medications Prior to Admission medications   Medication Sig Start Date End Date Taking? Authorizing Provider  aspirin EC 81 MG tablet Take 81 mg by mouth daily as needed for moderate pain. Swallow whole.    [provider]  Biotin 5000 MCG TABS Take 1 tablet by mouth daily.    [provider]  cholecalciferol (VITAMIN D3) 25 MCG (1000 UNIT) tablet Take 1,000 Units by mouth daily.    [provider]  diphenhydramine-acetaminophen (TYLENOL PM) 25-500 MG TABS tablet Take 1 tablet by mouth at bedtime as needed (sleep).    [provider]  Rivaroxaban 15 & 20 MG TBPK Take as directed on package: Start with one 15mg  tablet by mouth twice a day with food. On Day 22, switch to one 20mg  tablet once a day with food. Patient not taking: No sig reported 08/15/16   Hower, Aaron Mose, MD  vitamin B-12 (CYANOCOBALAMIN) 100 MCG tablet Take 100 mcg by mouth daily.    [provider]    Allergies    Sulfa antibiotics  Review of Systems   Review of Systems  Unable to perform ROS: Dementia  Constitutional:  Positive for fever.  HENT:  Negative for facial swelling.   Eyes:  Negative for redness.  Respiratory:  Negative for cough.   Gastrointestinal:  Positive for abdominal pain. Negative for vomiting.  Musculoskeletal:  Negative for neck stiffness.  Skin:  Negative for rash.  Psychiatric/Behavioral:  Positive for confusion.    Physical Exam Updated Vital Signs BP 118/78   Pulse 95   Temp 98.3 F (36.8 C) (Oral)   Resp (!) 24   SpO2 95%   Physical Exam Vitals and nursing note reviewed.  Constitutional:      General: She is not in acute distress.    Appearance: Normal appearance.  HENT:     Head: Normocephalic and atraumatic.     Nose: Nose normal.  Eyes:     Conjunctiva/sclera: Conjunctivae  normal.     Pupils: Pupils are equal, round, and reactive to light.  Cardiovascular:     Rate and Rhythm: Normal rate and regular rhythm.     Pulses: Normal pulses.     Heart sounds: Normal heart sounds.  Pulmonary:     Effort: Pulmonary effort is normal.     Breath sounds: Normal breath sounds.  Abdominal:     General: Bowel sounds are normal.     Tenderness: There is no guarding or rebound.  Musculoskeletal:     Cervical back: Normal range of motion and neck supple.     Right lower leg: No edema.     Left lower leg: No edema.  Skin:    General: Skin is warm and dry.     Capillary Refill: Capillary refill takes less than 2 seconds.  Neurological:     Mental Status: She is alert.     Deep Tendon Reflexes: Reflexes normal.    ED Results / Procedures / Treatments   Labs (all labs ordered are listed, but only abnormal results are displayed) Results for orders placed or performed during the hospital encounter of 10/21/21  CBC with Differential  Result Value Ref Range   WBC 8.9 4.0 - 10.5 K/uL   RBC 4.56 3.87 - 5.11 MIL/uL   Hemoglobin 13.2 12.0 - 15.0 g/dL   HCT 40.4 36.0 - 46.0 %   MCV 88.6 80.0 - 100.0 fL   MCH 28.9 26.0 - 34.0 pg   MCHC 32.7 30.0 - 36.0 g/dL   RDW 13.5 11.5 - 15.5 %   Platelets 198 150 - 400 K/uL   nRBC 0.0 0.0 - 0.2 %   Neutrophils Relative % 85 %   Neutro Abs 7.5 1.7 - 7.7 K/uL   Lymphocytes Relative 8 %   Lymphs Abs 0.7 0.7 - 4.0 K/uL   Monocytes Relative 7 %   Monocytes Absolute 0.6 0.1 - 1.0 K/uL   Eosinophils Relative 0 %   Eosinophils Absolute 0.0 0.0 - 0.5 K/uL   Basophils Relative 0 %   Basophils Absolute 0.0 0.0 - 0.1 K/uL   Immature Granulocytes 0 %   Abs Immature Granulocytes 0.04 0.00 - 0.07 K/uL  Comprehensive metabolic panel  Result Value Ref Range   Sodium 133 (L) 135 - 145 mmol/L   Potassium 3.8 3.5 - 5.1 mmol/L   Chloride 103 98 - 111 mmol/L   CO2 22 22 - 32 mmol/L   Glucose, Bld 128 (H) 70 - 99 mg/dL   BUN 14 8 - 23 mg/dL    Creatinine, Ser 0.83 0.44 - 1.00 mg/dL   Calcium 9.0 8.9 - 10.3 mg/dL   Total Protein 7.1 6.5 - 8.1 g/dL   Albumin 3.2 (L) 3.5 - 5.0 g/dL  AST 31 15 - 41 U/L   ALT 30 0 - 44 U/L   Alkaline Phosphatase 85 38 - 126 U/L   Total Bilirubin 0.4 0.3 - 1.2 mg/dL   GFR, Estimated >60 >60 mL/min   Anion gap 8 5 - 15  Urinalysis, Routine w reflex microscopic Urine, Clean Catch  Result Value Ref Range   Color, Urine YELLOW YELLOW   APPearance HAZY (A) CLEAR   Specific Gravity, Urine >1.046 (H) 1.005 - 1.030   pH 6.0 5.0 - 8.0   Glucose, UA NEGATIVE NEGATIVE mg/dL   Hgb urine dipstick NEGATIVE NEGATIVE   Bilirubin Urine NEGATIVE NEGATIVE   Ketones, ur NEGATIVE NEGATIVE mg/dL   Protein, ur NEGATIVE NEGATIVE mg/dL   Nitrite NEGATIVE NEGATIVE   Leukocytes,Ua LARGE (A) NEGATIVE   RBC / HPF 0-5 0 - 5 RBC/hpf   WBC, UA 21-50 0 - 5 WBC/hpf   Bacteria, UA RARE (A) NONE SEEN   Squamous Epithelial / LPF 6-10 0 - 5   Mucus PRESENT    Non Squamous Epithelial 0-5 (A) NONE SEEN  Lactic acid, plasma  Result Value Ref Range   Lactic Acid, Venous 1.6 0.5 - 1.9 mmol/L  Lipase, blood  Result Value Ref Range   Lipase 25 11 - 51 U/L   CT Head Wo Contrast  Result Date: 10/21/2021 CLINICAL DATA:  Melanoma and new altered mental status EXAM: CT HEAD WITHOUT CONTRAST TECHNIQUE: Contiguous axial images were obtained from the base of the skull through the vertex without intravenous contrast. COMPARISON:  02/20/2021 FINDINGS: Brain: No evidence of acute infarction, hemorrhage, cerebral edema, mass, mass effect, or midline shift. Ventricles and sulci are within normal limits for age. No extra-axial fluid collection. Vascular: No hyperdense vessel or unexpected calcification. Skull: Normal. Negative for fracture or focal lesion. Hyperostosis frontalis. Sinuses/Orbits: No acute finding. Other: Trace fluid in left mastoid air cells. IMPRESSION: No acute intracranial process. Electronically Signed   By: Merilyn Baba  M.D.   On: 10/21/2021 16:31   CT ABDOMEN PELVIS W CONTRAST  Result Date: 10/21/2021 CLINICAL DATA:  Abdominal pain and fever, history coronary artery disease post MI, hypertension, COPD EXAM: CT ABDOMEN AND PELVIS WITH CONTRAST TECHNIQUE: Multidetector CT imaging of the abdomen and pelvis was performed using the standard protocol following bolus administration of intravenous contrast. CONTRAST:  116mL OMNIPAQUE IOHEXOL 300 MG/ML  SOLN COMPARISON:  08/12/2016 FINDINGS: Lower chest: Minimal atelectasis at RIGHT lung base Hepatobiliary: Gallbladder and liver normal appearance Pancreas: Normal appearance Spleen: Normal appearance Adrenals/Urinary Tract: Tiny LEFT renal cyst. Adrenal glands, kidneys, ureters, and bladder otherwise normal appearance. Stomach/Bowel: Sigmoid diverticulosis with sigmoid wall thickening and pericolic inflammatory changes consistent with acute diverticulitis. No extraluminal gas or abscess. Normal appendix. Stomach and remaining bowel loops unremarkable. Vascular/Lymphatic: Atherosclerotic calcifications aorta, iliac arteries, visceral arteries, coronary arteries. Aorta normal caliber. No adenopathy. Reproductive: Uterus surgically absent. Normal appearing LEFT ovary. RIGHT ovary not visualized. Other: No free air or free fluid. Tiny umbilical hernia containing fat. Musculoskeletal: No acute osseous findings. IMPRESSION: Acute sigmoid diverticulitis without evidence of perforation or abscess. Tiny umbilical hernia containing fat. Aortic Atherosclerosis (ICD10-I70.0). Electronically Signed   By: Lavonia Dana M.D.   On: 10/21/2021 16:31    EKG None  Radiology CT Head Wo Contrast  Result Date: 10/21/2021 CLINICAL DATA:  Melanoma and new altered mental status EXAM: CT HEAD WITHOUT CONTRAST TECHNIQUE: Contiguous axial images were obtained from the base of the skull through the vertex without intravenous contrast. COMPARISON:  02/20/2021  FINDINGS: Brain: No evidence of acute infarction,  hemorrhage, cerebral edema, mass, mass effect, or midline shift. Ventricles and sulci are within normal limits for age. No extra-axial fluid collection. Vascular: No hyperdense vessel or unexpected calcification. Skull: Normal. Negative for fracture or focal lesion. Hyperostosis frontalis. Sinuses/Orbits: No acute finding. Other: Trace fluid in left mastoid air cells. IMPRESSION: No acute intracranial process. Electronically Signed   By: Merilyn Baba M.D.   On: 10/21/2021 16:31   CT ABDOMEN PELVIS W CONTRAST  Result Date: 10/21/2021 CLINICAL DATA:  Abdominal pain and fever, history coronary artery disease post MI, hypertension, COPD EXAM: CT ABDOMEN AND PELVIS WITH CONTRAST TECHNIQUE: Multidetector CT imaging of the abdomen and pelvis was performed using the standard protocol following bolus administration of intravenous contrast. CONTRAST:  168mL OMNIPAQUE IOHEXOL 300 MG/ML  SOLN COMPARISON:  08/12/2016 FINDINGS: Lower chest: Minimal atelectasis at RIGHT lung base Hepatobiliary: Gallbladder and liver normal appearance Pancreas: Normal appearance Spleen: Normal appearance Adrenals/Urinary Tract: Tiny LEFT renal cyst. Adrenal glands, kidneys, ureters, and bladder otherwise normal appearance. Stomach/Bowel: Sigmoid diverticulosis with sigmoid wall thickening and pericolic inflammatory changes consistent with acute diverticulitis. No extraluminal gas or abscess. Normal appendix. Stomach and remaining bowel loops unremarkable. Vascular/Lymphatic: Atherosclerotic calcifications aorta, iliac arteries, visceral arteries, coronary arteries. Aorta normal caliber. No adenopathy. Reproductive: Uterus surgically absent. Normal appearing LEFT ovary. RIGHT ovary not visualized. Other: No free air or free fluid. Tiny umbilical hernia containing fat. Musculoskeletal: No acute osseous findings. IMPRESSION: Acute sigmoid diverticulitis without evidence of perforation or abscess. Tiny umbilical hernia containing fat. Aortic  Atherosclerosis (ICD10-I70.0). Electronically Signed   By: Lavonia Dana M.D.   On: 10/21/2021 16:31    Procedures Procedures   Medications Ordered in ED Medications  lactated ringers infusion (has no administration in time range)  lactated ringers bolus 1,000 mL (has no administration in time range)    And  lactated ringers bolus 1,000 mL (has no administration in time range)    And  lactated ringers bolus 250 mL (has no administration in time range)  piperacillin-tazobactam (ZOSYN) IVPB 3.375 g (has no administration in time range)  acetaminophen (TYLENOL) tablet 650 mg (650 mg Oral Given 10/21/21 1504)  iohexol (OMNIPAQUE) 300 MG/ML solution 100 mL (100 mLs Intravenous Contrast Given 10/21/21 1625)  acetaminophen (TYLENOL) tablet 650 mg (650 mg Oral Given 10/21/21 2159)    ED Course  I have reviewed the triage vital signs and the nursing notes.  Pertinent labs & imaging results that were available during my care of the patient were reviewed by me and considered in my medical decision making (see chart for details).   309 case d/w Dr. Dorene Grebe who states patient may be placed on transfer list at [facility]     Final Clinical Impression(s) / ED Diagnoses Final diagnoses:  Acute cystitis without hematuria  Acute diverticulitis  SIRS (systemic inflammatory response syndrome) ([place])   Admit to medicine  Rx / DC Orders ED Discharge Orders     None        Kaylaann Mountz, MD 10/22/21 [phone]

## 2021-10-22 NOTE — H&P (Signed)
History and Physical    Raven Thompson DGU:[id number] DOB: [birth year] DOA: 10/21/2021  PCP: Ihor Austin Patient coming from: [facility]  Chief Complaint: Abdominal pain  HPI: Raven Thompson is a 75 y.o. female with medical history significant of CAD with stent, COPD, hypertension, PE in 2017, melanoma of left upper extremity presenting to the ED for evaluation of abdominal pain and altered mental status.  Febrile with temperature as high as 103.2 F.  Not tachycardic or hypotensive.  Labs showing no leukocytosis.  Lipase and LFTs normal.  UA with large amount of leukocytes, 21-50 WBCs, and rare bacteria.  Urine culture pending.  Lactic acid normal.  INR normal.  Blood cultures drawn.  COVID and influenza PCR pending.  Chest x-ray showing no active disease.  CT abdomen pelvis showing acute sigmoid diverticulitis without evidence of perforation or abscess.  Also showing tiny umbilical hernia containing fat.  CT head negative for acute intracranial abnormality. Patient was given Tylenol, Zosyn, and 2.25 L fluid boluses.  Patient appears confused.  Complaining of bilateral lower quadrant abdominal pain.  Per daughter, patient has been complaining of mostly right lower quadrant abdominal pain until today when she started complaining of left lower quadrant abdominal pain although daughter thinks it is difficult to tell where she is hurting as she is confused.  No fevers reported.  Patient denies nausea, vomiting, and diarrhea.  Denies dysuria, urinary frequency or urgency.  Denies cough, shortness of breath, or chest pain.  Daughter states patient was recently diagnosed with melanoma of her left arm and is supposed to start immunotherapy today.  Per daughter, she had a brain MRI done a week ago which did not show a brain mass but her oncologist is planning on repeating MRI with contrast.  Per daughter, patient stopped visiting her cardiologist and stopped taking all of her home medications 5 or  6 years ago.  She is seen by a primary care physician at her assisted living facility but does not want to take any medications.  Review of Systems:  All systems reviewed and apart from history of presenting illness, are negative.  Past Medical History:  Diagnosis Date   CAD (coronary artery disease)    COPD (chronic obstructive pulmonary disease) ([place])    Hypertension    MI (myocardial infarction) ([place])     Past Surgical History:  Procedure Laterality Date   ABDOMINAL HYSTERECTOMY     CORONARY ANGIOPLASTY WITH STENT PLACEMENT     TONSILLECTOMY       reports that she has been smoking cigarettes. She has never used smokeless tobacco. She reports that she does not drink alcohol and does not use drugs.  Allergies  Allergen Reactions   Sulfa Antibiotics Nausea Only    History reviewed. No pertinent family history.  Prior to Admission medications   Medication Sig Start Date End Date Taking? Authorizing Provider  acetaminophen (TYLENOL) 650 MG CR tablet Take 650 mg by mouth every 8 (eight) hours as needed for pain.   Yes [provider]  alum & mag hydroxide-simeth (MAALOX/MYLANTA) 200-200-20 MG/5ML suspension Take 30 mLs by mouth every 6 (six) hours as needed for indigestion.   Yes [provider]  Biotin 1 MG CAPS Take 1 mg by mouth daily.   Yes [provider]  cholecalciferol (VITAMIN D3) 25 MCG (1000 UNIT) tablet Take 1,000 Units by mouth daily.   Yes [provider]  Cyanocobalamin (VITAMIN B-12) 500 MCG LOZG Take 500 mcg by mouth daily.  Yes [provider]  guaiFENesin (ROBITUSSIN) 100 MG/5ML liquid Take 10 mLs by mouth every 6 (six) hours as needed for cough.   Yes [provider]  loperamide (IMODIUM) 2 MG capsule Take 2 mg by mouth as needed for diarrhea or loose stools (max 8 doses in 24 hours).   Yes [provider]  magnesium hydroxide (MILK OF MAGNESIA) 400 MG/5ML suspension Take 30 mLs by mouth at bedtime  as needed for mild constipation.   Yes [provider]  Melatonin 10 MG CAPS Take 10 mg by mouth at bedtime.   Yes [provider]  neomycin-bacitracin-polymyxin (NEOSPORIN) [phone] ointment Apply 1 application topically daily as needed (apply daily as needed for minor skin tears or abrasions).   Yes [provider]  benzocaine (ORAJEL) 10 % mucosal gel Use as directed 1 application in the mouth or throat See admin instructions. Apply to affected area(s) bid x 5 days Patient not taking: No sig reported    [provider]  carbamide peroxide (GLY-OXIDE) 10 % solution Place 1 application onto teeth See admin instructions. Give 3 drops on affected area in mouth 4 times daily x 5 days Patient not taking: No sig reported    [provider]  diphenhydramine-acetaminophen (TYLENOL PM) 25-500 MG TABS tablet Take 1 tablet by mouth at bedtime as needed (sleep). Patient not taking: No sig reported    [provider]  Rivaroxaban 15 & 20 MG TBPK Take as directed on package: Start with one 15mg  tablet by mouth twice a day with food. On Day 22, switch to one 20mg  tablet once a day with food. Patient not taking: No sig reported 08/15/16   Lytle Butte, MD    Physical Exam: Vitals:   10/22/21 0230 10/22/21 0330 10/22/21 0345 10/22/21 0400  BP: 118/78 121/71 129/87 131/68  Pulse: 95 81 84 87  Resp: (!) 24 20  20   Temp:   100.2 F (37.9 C)   TempSrc:      SpO2: 95% 98% 100% 100%  Weight:   85.7 kg   Height:   5\' 3"  (1.6 m)     Physical Exam Constitutional:      General: She is not in acute distress. HENT:     Head: Normocephalic and atraumatic.  Eyes:     Extraocular Movements: Extraocular movements intact.  Cardiovascular:     Rate and Rhythm: Normal rate and regular rhythm.     Pulses: Normal pulses.  Pulmonary:     Effort: Pulmonary effort is normal. No respiratory distress.     Breath sounds: Normal breath sounds. No wheezing or  rales.  Abdominal:     General: Bowel sounds are normal.     Palpations: Abdomen is soft.     Tenderness: There is abdominal tenderness. There is no guarding or rebound.     Comments: Left lower quadrant tender to palpation  Musculoskeletal:        General: No swelling or tenderness.     Cervical back: Normal range of motion and neck supple.  Skin:    General: Skin is warm and dry.  Neurological:     General: No focal deficit present.     Mental Status: She is alert.     Labs on Admission: I have personally reviewed following labs and imaging studies  CBC: Recent Labs  Lab 10/21/21 1501  WBC 8.9  NEUTROABS 7.5  HGB 13.2  HCT 40.4  MCV 88.6  PLT 198  Basic Metabolic Panel: Recent Labs  Lab 10/21/21 1501  NA 133*  K 3.8  CL 103  CO2 22  GLUCOSE 128*  BUN 14  CREATININE 0.83  CALCIUM 9.0   GFR: Estimated Creatinine Clearance: 60.7 mL/min (by C-G formula based on SCr of 0.83 mg/dL). Liver Function Tests: Recent Labs  Lab 10/21/21 1501  AST 31  ALT 30  ALKPHOS 85  BILITOT 0.4  PROT 7.1  ALBUMIN 3.2*   Recent Labs  Lab 10/21/21 1501  LIPASE 25   No results for input(s): AMMONIA in the last 168 hours. Coagulation Profile: Recent Labs  Lab 10/22/21 0330  INR 1.2   Cardiac Enzymes: No results for input(s): CKTOTAL, CKMB, CKMBINDEX, TROPONINI in the last 168 hours. BNP (last 3 results) No results for input(s): PROBNP in the last 8760 hours. HbA1C: No results for input(s): HGBA1C in the last 72 hours. CBG: No results for input(s): GLUCAP in the last 168 hours. Lipid Profile: No results for input(s): CHOL, HDL, LDLCALC, TRIG, CHOLHDL, LDLDIRECT in the last 72 hours. Thyroid Function Tests: No results for input(s): TSH, T4TOTAL, FREET4, T3FREE, THYROIDAB in the last 72 hours. Anemia Panel: No results for input(s): VITAMINB12, FOLATE, FERRITIN, TIBC, IRON, RETICCTPCT in the last 72 hours. Urine analysis:    Component Value Date/Time   COLORURINE  YELLOW 10/21/2021 1701   APPEARANCEUR HAZY (A) 10/21/2021 1701   LABSPEC >1.046 (H) 10/21/2021 1701   PHURINE 6.0 10/21/2021 1701   GLUCOSEU NEGATIVE 10/21/2021 1701   HGBUR NEGATIVE 10/21/2021 1701   BILIRUBINUR NEGATIVE 10/21/2021 1701   KETONESUR NEGATIVE 10/21/2021 1701   PROTEINUR NEGATIVE 10/21/2021 1701   NITRITE NEGATIVE 10/21/2021 1701   LEUKOCYTESUR LARGE (A) 10/21/2021 1701    Radiological Exams on Admission: DG Chest 1 View  Result Date: 10/22/2021 CLINICAL DATA:  Altered mental status EXAM: CHEST  1 VIEW COMPARISON:  02/21/2021 FINDINGS: The heart size and mediastinal contours are within normal limits. Both lungs are clear. The visualized skeletal structures are unremarkable. IMPRESSION: No active disease. Electronically Signed   By: Ulyses Jarred M.D.   On: 10/22/2021 03:09   CT Head Wo Contrast  Result Date: 10/21/2021 CLINICAL DATA:  Melanoma and new altered mental status EXAM: CT HEAD WITHOUT CONTRAST TECHNIQUE: Contiguous axial images were obtained from the base of the skull through the vertex without intravenous contrast. COMPARISON:  02/20/2021 FINDINGS: Brain: No evidence of acute infarction, hemorrhage, cerebral edema, mass, mass effect, or midline shift. Ventricles and sulci are within normal limits for age. No extra-axial fluid collection. Vascular: No hyperdense vessel or unexpected calcification. Skull: Normal. Negative for fracture or focal lesion. Hyperostosis frontalis. Sinuses/Orbits: No acute finding. Other: Trace fluid in left mastoid air cells. IMPRESSION: No acute intracranial process. Electronically Signed   By: Merilyn Baba M.D.   On: 10/21/2021 16:31   CT ABDOMEN PELVIS W CONTRAST  Result Date: 10/21/2021 CLINICAL DATA:  Abdominal pain and fever, history coronary artery disease post MI, hypertension, COPD EXAM: CT ABDOMEN AND PELVIS WITH CONTRAST TECHNIQUE: Multidetector CT imaging of the abdomen and pelvis was performed using the standard protocol  following bolus administration of intravenous contrast. CONTRAST:  155mL OMNIPAQUE IOHEXOL 300 MG/ML  SOLN COMPARISON:  08/12/2016 FINDINGS: Lower chest: Minimal atelectasis at RIGHT lung base Hepatobiliary: Gallbladder and liver normal appearance Pancreas: Normal appearance Spleen: Normal appearance Adrenals/Urinary Tract: Tiny LEFT renal cyst. Adrenal glands, kidneys, ureters, and bladder otherwise normal appearance. Stomach/Bowel: Sigmoid diverticulosis with sigmoid wall thickening and pericolic inflammatory changes consistent with acute  diverticulitis. No extraluminal gas or abscess. Normal appendix. Stomach and remaining bowel loops unremarkable. Vascular/Lymphatic: Atherosclerotic calcifications aorta, iliac arteries, visceral arteries, coronary arteries. Aorta normal caliber. No adenopathy. Reproductive: Uterus surgically absent. Normal appearing LEFT ovary. RIGHT ovary not visualized. Other: No free air or free fluid. Tiny umbilical hernia containing fat. Musculoskeletal: No acute osseous findings. IMPRESSION: Acute sigmoid diverticulitis without evidence of perforation or abscess. Tiny umbilical hernia containing fat. Aortic Atherosclerosis (ICD10-I70.0). Electronically Signed   By: Lavonia Dana M.D.   On: 10/21/2021 16:31    EKG: Independently reviewed.  EKG done on arrival showing sinus rhythm, no significant change compared to prior tracing.  Assessment/Plan Principal Problem:   Acute diverticulitis Active Problems:   CAD (coronary artery disease)   Essential hypertension   UTI (urinary tract infection)   Acute metabolic encephalopathy   Acute diverticulitis CT showing acute sigmoid diverticulitis without evidence of perforation or abscess.  Febrile with temperature as high as 103.2 F in the ED.  No other signs of sepsis such as tachycardia, hypotension, leukocytosis, or lactic acidosis. -Continue Zosyn.  Tylenol as needed for fevers.  Blood cultures pending.  Acute UTI UA with large  amount of leukocytes, 21-50 WBCs, and rare bacteria.  Patient is febrile but no other signs of sepsis at this time. -Continue Zosyn.  Tylenol as needed for fevers.  Urine and blood cultures pending.  Acute metabolic encephalopathy Likely due to UTI and acute diverticulitis.  CT head negative for acute intracranial abnormality.  No focal neurodeficit.   -Continue to monitor  History of CAD with stent No acute ischemic changes on EKG.  Not on any meds.  No cardiology notes seen in the chart here or under Care Everywhere.  COPD Stable.  Not on any inhalers at home.  Hypertension Stable.  Not on any antihypertensives.  Melanoma of left upper extremity Followed by oncology at [facility]. She had a brain MRI without contrast done at [facility] on 10/09/2021 which did not show evidence of brain mets.  She is supposed to receive her first infusion of nivolumab today.  -Will need rescheduling and close oncology follow-up  DVT prophylaxis: Lovenox Code Status: Full code-discussed with the patient and her daughter. Family Communication: Daughter at bedside. Disposition Plan: Status is: Observation  The patient remains OBS appropriate and will d/c before 2 midnights.  Level of care: Level of care: Telemetry Medical  The medical decision making on this patient was of high complexity and the patient is at high risk for clinical deterioration, therefore this is a level 3 visit.  Shela Leff MD [facility]  If 7PM-7AM, please contact night-coverage [email or web address]  10/22/2021, 4:50 AM

## 2021-10-22 NOTE — Progress Notes (Signed)
PROGRESS NOTE                                                                                                                                                                                                             Patient Demographics:    Raven Thompson, is a 75 y.o. female, DOB - [birth year], TAV:[id number]  Outpatient Primary MD for the patient is Ihor Austin    LOS - 0  Admit date - 10/21/2021    Chief complaint.  Confused at assisted living facility.      Brief Narrative (HPI from H&P)  Raven Thompson is a 75 y.o. female with medical history significant of CAD with stent, COPD, hypertension, PE in 2017, melanoma of left upper extremity presenting to the ED for evaluation of abdominal pain and altered mental status. In the ER diagnosed with acute metabolic encephalopathy due to acute diverticulitis and admitted to the hospital.   Subjective:    Raven Thompson today has, No headache, No chest pain, No abdominal pain - No Nausea, No new weakness tingling or numbness, no shortness of breath.   Assessment  & Plan :     Acute Metabolic Encephalopathy -in a patient with underlying dementia due to acute diverticulitis, being treated with bowel rest, IV fluids and IV antibiotics.  CT scan reassuring as there is no abscess, continue to monitor of note patient has had no GI follow-up ever, will benefit from outpatient colonoscopy if appropriate, based on #2 below.  2.  Recently diagnosed metastatic left shoulder melanoma.  Being followed outpatient, needs MRI of the brain once clinically stable.  CT head unremarkable.  3.  Underlying dementia.  At risk for delirium.  Minimize narcotics and benzodiazepine use.  4.  Vitamin B12 deficiency.  Continue replacement.  5. Obesity - BMI 33- follow with PCP.        Condition - Extremely Guarded  Family Communication  :  daughter bedside 10/22/21 - DNR  Code Status :   DNR  Consults  :  None  PUD Prophylaxis : PPI   Procedures  :     CT Head - Non acute  CT Abd - Pelvis - Acute sigmoid diverticulitis without evidence of perforation or abscess. Tiny umbilical hernia containing fat. Aortic Atherosclerosis       Disposition Plan  :  Status is: Observation  DVT Prophylaxis  :    enoxaparin (LOVENOX) injection 40 mg Start: 10/22/21 1000   Lab Results  Component Value Date   PLT 198 10/21/2021    Diet :  Diet Order             Diet full liquid Room service appropriate? Yes; Fluid consistency: Thin  Diet effective now                    Inpatient Medications  Scheduled Meds:  cholecalciferol  1,000 Units Oral Daily   enoxaparin (LOVENOX) injection  40 mg Subcutaneous Q24H   vitamin B-12  500 mcg Oral Daily   Continuous Infusions:  lactated ringers 75 mL/hr at 10/22/21 0738   piperacillin-tazobactam (ZOSYN)  IV     PRN Meds:.acetaminophen **OR** acetaminophen  Antibiotics  :    Anti-infectives (From admission, onward)    Start     Dose/Rate Route Frequency Ordered Stop   10/22/21 1000  piperacillin-tazobactam (ZOSYN) IVPB 3.375 g        3.375 g 12.5 mL/hr over 240 Minutes Intravenous Every 8 hours 10/22/21 0518     10/22/21 0300  piperacillin-tazobactam (ZOSYN) IVPB 3.375 g        3.375 g 100 mL/hr over 30 Minutes Intravenous  Once 10/22/21 0247 10/22/21 0410        Time Spent in minutes  30   Raven Thompson M.D on 10/22/2021 at 8:21 AM  To page go to [email or web address]   [facility] -  Office  [phone]  See all Orders from today for further details    Objective:   Vitals:   10/22/21 0600 10/22/21 0615 10/22/21 0645 10/22/21 0731  BP: 128/69 127/77 124/80 127/70  Pulse: (!) 183 80 73 83  Resp: 20   (!) 28  Temp:    98.9 F (37.2 C)  TempSrc:    Oral  SpO2: (!) 87% 98% 96% 98%  Weight:      Height:        Wt Readings from Last 3 Encounters:  10/22/21 85.7 kg  08/11/18 74.8 kg  03/13/18  79.4 kg     Intake/Output Summary (Last 24 hours) at 10/22/2021 2025 Last data filed at 10/22/2021 4270 Gross per 24 hour  Intake 2533.93 ml  Output 250 ml  Net 2283.93 ml     Physical Exam  Awake but confused, No new F.N deficits,   [place].AT,PERRAL Supple Neck, No JVD,   Symmetrical Chest wall movement, Good air movement bilaterally, CTAB RRR,No Gallops,Rubs or new Murmurs,  +ve B.Sounds, Abd Soft, No tenderness,   No Cyanosis, Clubbing or edema       Data Review:    CBC Recent Labs  Lab 10/21/21 1501  WBC 8.9  HGB 13.2  HCT 40.4  PLT 198  MCV 88.6  MCH 28.9  MCHC 32.7  RDW 13.5  LYMPHSABS 0.7  MONOABS 0.6  EOSABS 0.0  BASOSABS 0.0    Recent Labs  Lab 10/21/21 1501 10/21/21 1518 10/22/21 0330  NA 133*  --   --   K 3.8  --   --   CL 103  --   --   CO2 22  --   --   GLUCOSE 128*  --   --   BUN 14  --   --   CREATININE 0.83  --   --   CALCIUM 9.0  --   --   AST 31  --   --  ALT 30  --   --   ALKPHOS 85  --   --   BILITOT 0.4  --   --   ALBUMIN 3.2*  --   --   LATICACIDVEN  --  1.6  --   INR  --   --  1.2    ------------------------------------------------------------------------------------------------------------------ No results for input(s): CHOL, HDL, LDLCALC, TRIG, CHOLHDL, LDLDIRECT in the last 72 hours.  No results found for: HGBA1C ------------------------------------------------------------------------------------------------------------------ No results for input(s): TSH, T4TOTAL, T3FREE, THYROIDAB in the last 72 hours.  Invalid input(s): FREET3  Cardiac Enzymes No results for input(s): CKMB, TROPONINI, MYOGLOBIN in the last 168 hours.  Invalid input(s): CK ------------------------------------------------------------------------------------------------------------------ No results found for: BNP    Radiology Reports DG Chest 1 View  Result Date: 10/22/2021 CLINICAL DATA:  Altered mental status EXAM: CHEST  1 VIEW COMPARISON:   02/21/2021 FINDINGS: The heart size and mediastinal contours are within normal limits. Both lungs are clear. The visualized skeletal structures are unremarkable. IMPRESSION: No active disease. Electronically Signed   By: Ulyses Jarred M.D.   On: 10/22/2021 03:09   CT Head Wo Contrast  Result Date: 10/21/2021 CLINICAL DATA:  Melanoma and new altered mental status EXAM: CT HEAD WITHOUT CONTRAST TECHNIQUE: Contiguous axial images were obtained from the base of the skull through the vertex without intravenous contrast. COMPARISON:  02/20/2021 FINDINGS: Brain: No evidence of acute infarction, hemorrhage, cerebral edema, mass, mass effect, or midline shift. Ventricles and sulci are within normal limits for age. No extra-axial fluid collection. Vascular: No hyperdense vessel or unexpected calcification. Skull: Normal. Negative for fracture or focal lesion. Hyperostosis frontalis. Sinuses/Orbits: No acute finding. Other: Trace fluid in left mastoid air cells. IMPRESSION: No acute intracranial process. Electronically Signed   By: Merilyn Baba M.D.   On: 10/21/2021 16:31   CT ABDOMEN PELVIS W CONTRAST  Result Date: 10/21/2021 CLINICAL DATA:  Abdominal pain and fever, history coronary artery disease post MI, hypertension, COPD EXAM: CT ABDOMEN AND PELVIS WITH CONTRAST TECHNIQUE: Multidetector CT imaging of the abdomen and pelvis was performed using the standard protocol following bolus administration of intravenous contrast. CONTRAST:  168mL OMNIPAQUE IOHEXOL 300 MG/ML  SOLN COMPARISON:  08/12/2016 FINDINGS: Lower chest: Minimal atelectasis at RIGHT lung base Hepatobiliary: Gallbladder and liver normal appearance Pancreas: Normal appearance Spleen: Normal appearance Adrenals/Urinary Tract: Tiny LEFT renal cyst. Adrenal glands, kidneys, ureters, and bladder otherwise normal appearance. Stomach/Bowel: Sigmoid diverticulosis with sigmoid wall thickening and pericolic inflammatory changes consistent with acute  diverticulitis. No extraluminal gas or abscess. Normal appendix. Stomach and remaining bowel loops unremarkable. Vascular/Lymphatic: Atherosclerotic calcifications aorta, iliac arteries, visceral arteries, coronary arteries. Aorta normal caliber. No adenopathy. Reproductive: Uterus surgically absent. Normal appearing LEFT ovary. RIGHT ovary not visualized. Other: No free air or free fluid. Tiny umbilical hernia containing fat. Musculoskeletal: No acute osseous findings. IMPRESSION: Acute sigmoid diverticulitis without evidence of perforation or abscess. Tiny umbilical hernia containing fat. Aortic Atherosclerosis (ICD10-I70.0). Electronically Signed   By: Lavonia Dana M.D.   On: 10/21/2021 16:31

## 2021-10-22 NOTE — ED Notes (Signed)
Spoke with RN [facility], pt is still on their wait list and waiting for a bed

## 2021-10-22 NOTE — ED Notes (Signed)
Breakfast Orders placed 

## 2021-10-22 NOTE — Progress Notes (Signed)
Pharmacy Antibiotic Note  Raven Thompson is a 75 y.o. female admitted on 10/21/2021 with  acute cystitis, acute diverticulitis, and SIRS/sepsis .  Pharmacy has been consulted for Zosyn dosing.  Plan: Zosyn 3.375g IV q8h (4 hour infusion).  Height: 5\' 3"  (160 cm) Weight: 85.7 kg (189 lb) IBW/kg (Calculated) : 52.4  Temp (24hrs), Avg:100.3 F (37.9 C), Min:98.3 F (36.8 C), Max:103.2 F (39.6 C)  Recent Labs  Lab 10/21/21 1501 10/21/21 1518  WBC 8.9  --   CREATININE 0.83  --   LATICACIDVEN  --  1.6    Estimated Creatinine Clearance: 60.7 mL/min (by C-G formula based on SCr of 0.83 mg/dL).    Allergies  Allergen Reactions   Sulfa Antibiotics Nausea Only    Thank you for allowing pharmacy to be a part of this patient's care.  Wynona Neat, PharmD, BCPS  10/22/2021 5:02 AM

## 2021-10-23 DIAGNOSIS — G9341 Metabolic encephalopathy: Secondary | ICD-10-CM

## 2021-10-23 LAB — CBC WITH DIFFERENTIAL/PLATELET
Abs Immature Granulocytes: 0.04 10*3/uL (ref 0.00–0.07)
Basophils Absolute: 0 10*3/uL (ref 0.0–0.1)
Basophils Relative: 0 %
Eosinophils Absolute: 0 10*3/uL (ref 0.0–0.5)
Eosinophils Relative: 0 %
HCT: 35.5 % — ABNORMAL LOW (ref 36.0–46.0)
Hemoglobin: 11.7 g/dL — ABNORMAL LOW (ref 12.0–15.0)
Immature Granulocytes: 0 %
Lymphocytes Relative: 13 %
Lymphs Abs: 1.2 10*3/uL (ref 0.7–4.0)
MCH: 29 pg (ref 26.0–34.0)
MCHC: 33 g/dL (ref 30.0–36.0)
MCV: 87.9 fL (ref 80.0–100.0)
Monocytes Absolute: 1.1 10*3/uL — ABNORMAL HIGH (ref 0.1–1.0)
Monocytes Relative: 13 %
Neutro Abs: 6.6 10*3/uL (ref 1.7–7.7)
Neutrophils Relative %: 74 %
Platelets: 171 10*3/uL (ref 150–400)
RBC: 4.04 MIL/uL (ref 3.87–5.11)
RDW: 13.6 % (ref 11.5–15.5)
WBC: 8.9 10*3/uL (ref 4.0–10.5)
nRBC: 0 % (ref 0.0–0.2)

## 2021-10-23 LAB — BLOOD CULTURE ID PANEL (REFLEXED) - BCID2

## 2021-10-23 LAB — BRAIN NATRIURETIC PEPTIDE: B Natriuretic Peptide: 296.4 pg/mL — ABNORMAL HIGH (ref 0.0–100.0)

## 2021-10-23 LAB — COMPREHENSIVE METABOLIC PANEL
ALT: 24 U/L (ref 0–44)
AST: 20 U/L (ref 15–41)
Albumin: 2.5 g/dL — ABNORMAL LOW (ref 3.5–5.0)
Alkaline Phosphatase: 66 U/L (ref 38–126)
Anion gap: 6 (ref 5–15)
BUN: 7 mg/dL — ABNORMAL LOW (ref 8–23)
CO2: 24 mmol/L (ref 22–32)
Calcium: 8.4 mg/dL — ABNORMAL LOW (ref 8.9–10.3)
Chloride: 106 mmol/L (ref 98–111)
Creatinine, Ser: 0.75 mg/dL (ref 0.44–1.00)
GFR, Estimated: >60 mL/min (ref >60)
Glucose, Bld: 101 mg/dL — ABNORMAL HIGH (ref 70–99)
Potassium: 3.9 mmol/L (ref 3.5–5.1)
Sodium: 136 mmol/L (ref 135–145)
Total Bilirubin: 0.6 mg/dL (ref 0.3–1.2)
Total Protein: 5.8 g/dL — ABNORMAL LOW (ref 6.5–8.1)

## 2021-10-23 LAB — MAGNESIUM: Magnesium: 2.1 mg/dL (ref 1.7–2.4)

## 2021-10-23 LAB — C-REACTIVE PROTEIN: CRP: 15 mg/dL — ABNORMAL HIGH (ref <1.0)

## 2021-10-23 NOTE — Progress Notes (Signed)
PROGRESS NOTE                                                                                                                                                                                                             Patient Demographics:    Raven Thompson, is a 75 y.o. female, DOB - 03-06-1946, ERD:[id number]  Outpatient Primary MD for the patient is Ihor Austin    LOS - 1  Admit date - 10/21/2021    Chief complaint.  Confused at assisted living facility.      Brief Narrative (HPI from H&P)   Raven Thompson is a 75 y.o. female with medical history significant of CAD with stent, COPD, hypertension, PE in 2017, melanoma of left upper extremity presenting to the ED for evaluation of abdominal pain and altered mental status. In the ER diagnosed with acute metabolic encephalopathy due to acute diverticulitis and admitted to the hospital.   Subjective:    Raven Thompson today with no significant events overnight as discussed with staff, she was tolerating her full liquid diet this morning with feeding assistance from staff.  .    Assessment  & Plan :   Acute Metabolic Encephalopathy/acute diverticulitis-in a patient with underlying dementia due to acute diverticulitis, being treated with bowel rest, IV fluids and IV antibiotics.  CT scan reassuring as there is no abscess, continue to monitor of note patient has had no GI follow-up ever, will benefit from outpatient colonoscopy if appropriate, she is tolerating full liquid diet, will continue with IV antibiotics, full liquid diet, will advance tomorrow if remains afebrile, no abdominal tenderness and normal white blood cell count, CRP is trending up, will continue to monitor closely.  Recently diagnosed metastatic left shoulder melanoma.  Being followed outpatient, plan for MRI brain with/without contrast this Saturday, family to discuss with primary oncologist if needed to  be done here at [facility], as well she was supposed to start nivolumab 11/2, I have informed daughter this has to hold for now till she is seen by her oncologist.  Underlying dementia.  At risk for delirium.  Minimize narcotics and benzodiazepine use.  Vitamin B12 deficiency.  Continue replacement.  Obesity - BMI 33- follow with PCP.        Condition - Extremely Guarded  Family Communication  :  daughter  [name] by phone 10/23/21 - DNR  Code Status :  DNR  Consults  :  None  PUD Prophylaxis : PPI   Procedures  :     CT Head - Non acute  CT Abd - Pelvis - Acute sigmoid diverticulitis without evidence of perforation or abscess. Tiny umbilical hernia containing fat. Aortic Atherosclerosis       Disposition Plan  :    Status is: Observation  DVT Prophylaxis  :    enoxaparin (LOVENOX) injection 40 mg Start: 10/22/21 1000   Lab Results  Component Value Date   PLT 171 10/23/2021    Diet :  Diet Order             Diet full liquid Room service appropriate? Yes; Fluid consistency: Thin  Diet effective now                    Inpatient Medications  Scheduled Meds:  cholecalciferol  1,000 Units Oral Daily   docusate sodium  100 mg Oral BID   enoxaparin (LOVENOX) injection  40 mg Subcutaneous Q24H   pantoprazole  40 mg Oral Daily   vitamin B-12  500 mcg Oral Daily   Continuous Infusions:  piperacillin-tazobactam (ZOSYN)  IV 3.375 g (10/23/21 1021)   PRN Meds:.acetaminophen **OR** acetaminophen  Antibiotics  :    Anti-infectives (From admission, onward)    Start     Dose/Rate Route Frequency Ordered Stop   10/22/21 1000  piperacillin-tazobactam (ZOSYN) IVPB 3.375 g        3.375 g 12.5 mL/hr over 240 Minutes Intravenous Every 8 hours 10/22/21 0518     10/22/21 0300  piperacillin-tazobactam (ZOSYN) IVPB 3.375 g        3.375 g 100 mL/hr over 30 Minutes Intravenous  Once 10/22/21 0247 10/22/21 0410          Parisa Pinela M.D on  10/23/2021 at 3:17 PM  To page go to [email or web address]   [facility] -  Office  [phone]  See all Orders from today for further details    Objective:   Vitals:   10/22/21 1906 10/23/21 0003 10/23/21 0416 10/23/21 0856  BP: 121/86 (!) 124/98 136/76 119/70  Pulse: 85 94 95 70  Resp: 20 17 17 19   Temp: 98.4 F (36.9 C) 98 F (36.7 C) 98.6 F (37 C) 98.3 F (36.8 C)  TempSrc: Oral   Oral  SpO2: 99% 97% 96% 95%  Weight:      Height:        Wt Readings from Last 3 Encounters:  10/22/21 85.7 kg  08/11/18 74.8 kg  03/13/18 79.4 kg     Intake/Output Summary (Last 24 hours) at 10/23/2021 1517 Last data filed at 10/23/2021 1500 Gross per 24 hour  Intake 1534.16 ml  Output 1300 ml  Net 234.16 ml     Physical Exam  Awake Alert, confused, pleasent Symmetrical Chest wall movement, Good air movement bilaterally, CTAB RRR,No Gallops,Rubs or new Murmurs, No Parasternal Heave +ve B.Sounds, Abd Soft, No tenderness, No rebound - guarding or rigidity. No Cyanosis, Clubbing or edema, No new Rash or bruise         Data Review:    CBC Recent Labs  Lab 10/21/21 1501 10/23/21 0124  WBC 8.9 8.9  HGB 13.2 11.7*  HCT 40.4 35.5*  PLT 198 171  MCV 88.6 87.9  MCH 28.9 29.0  MCHC 32.7 33.0  RDW 13.5 13.6  LYMPHSABS 0.7 1.2  MONOABS 0.6 1.1*  EOSABS  0.0 0.0  BASOSABS 0.0 0.0    Recent Labs  Lab 10/21/21 1501 10/21/21 1518 10/22/21 0330 10/22/21 0738 10/23/21 0124  NA 133*  --   --   --  136  K 3.8  --   --   --  3.9  CL 103  --   --   --  106  CO2 22  --   --   --  24  GLUCOSE 128*  --   --   --  101*  BUN 14  --   --   --  7*  CREATININE 0.83  --   --   --  0.75  CALCIUM 9.0  --   --   --  8.4*  AST 31  --   --   --  20  ALT 30  --   --   --  24  ALKPHOS 85  --   --   --  66  BILITOT 0.4  --   --   --  0.6  ALBUMIN 3.2*  --   --   --  2.5*  MG  --   --   --   --  2.1  CRP  --   --   --  12.5* 15.0*  LATICACIDVEN  --  1.6  --   --   --   INR  --   --   1.2  --   --   BNP  --   --   --   --  296.4*    ------------------------------------------------------------------------------------------------------------------ No results for input(s): CHOL, HDL, LDLCALC, TRIG, CHOLHDL, LDLDIRECT in the last 72 hours.  No results found for: HGBA1C ------------------------------------------------------------------------------------------------------------------ No results for input(s): TSH, T4TOTAL, T3FREE, THYROIDAB in the last 72 hours.  Invalid input(s): FREET3  Cardiac Enzymes No results for input(s): CKMB, TROPONINI, MYOGLOBIN in the last 168 hours.  Invalid input(s): CK ------------------------------------------------------------------------------------------------------------------    Component Value Date/Time   BNP 296.4 (H) 10/23/2021 0124      Radiology Reports DG Chest 1 View  Result Date: 10/22/2021 CLINICAL DATA:  Altered mental status EXAM: CHEST  1 VIEW COMPARISON:  02/21/2021 FINDINGS: The heart size and mediastinal contours are within normal limits. Both lungs are clear. The visualized skeletal structures are unremarkable. IMPRESSION: No active disease. Electronically Signed   By: Ulyses Jarred M.D.   On: 10/22/2021 03:09   CT Head Wo Contrast  Result Date: 10/21/2021 CLINICAL DATA:  Melanoma and new altered mental status EXAM: CT HEAD WITHOUT CONTRAST TECHNIQUE: Contiguous axial images were obtained from the base of the skull through the vertex without intravenous contrast. COMPARISON:  02/20/2021 FINDINGS: Brain: No evidence of acute infarction, hemorrhage, cerebral edema, mass, mass effect, or midline shift. Ventricles and sulci are within normal limits for age. No extra-axial fluid collection. Vascular: No hyperdense vessel or unexpected calcification. Skull: Normal. Negative for fracture or focal lesion. Hyperostosis frontalis. Sinuses/Orbits: No acute finding. Other: Trace fluid in left mastoid air cells. IMPRESSION: No  acute intracranial process. Electronically Signed   By: Merilyn Baba M.D.   On: 10/21/2021 16:31   CT ABDOMEN PELVIS W CONTRAST  Result Date: 10/21/2021 CLINICAL DATA:  Abdominal pain and fever, history coronary artery disease post MI, hypertension, COPD EXAM: CT ABDOMEN AND PELVIS WITH CONTRAST TECHNIQUE: Multidetector CT imaging of the abdomen and pelvis was performed using the standard protocol following bolus administration of intravenous contrast. CONTRAST:  147mL OMNIPAQUE IOHEXOL 300 MG/ML  SOLN COMPARISON:  08/12/2016 FINDINGS:  Lower chest: Minimal atelectasis at RIGHT lung base Hepatobiliary: Gallbladder and liver normal appearance Pancreas: Normal appearance Spleen: Normal appearance Adrenals/Urinary Tract: Tiny LEFT renal cyst. Adrenal glands, kidneys, ureters, and bladder otherwise normal appearance. Stomach/Bowel: Sigmoid diverticulosis with sigmoid wall thickening and pericolic inflammatory changes consistent with acute diverticulitis. No extraluminal gas or abscess. Normal appendix. Stomach and remaining bowel loops unremarkable. Vascular/Lymphatic: Atherosclerotic calcifications aorta, iliac arteries, visceral arteries, coronary arteries. Aorta normal caliber. No adenopathy. Reproductive: Uterus surgically absent. Normal appearing LEFT ovary. RIGHT ovary not visualized. Other: No free air or free fluid. Tiny umbilical hernia containing fat. Musculoskeletal: No acute osseous findings. IMPRESSION: Acute sigmoid diverticulitis without evidence of perforation or abscess. Tiny umbilical hernia containing fat. Aortic Atherosclerosis (ICD10-I70.0). Electronically Signed   By: Lavonia Dana M.D.   On: 10/21/2021 16:31

## 2021-10-23 NOTE — Progress Notes (Signed)
Physical Therapy Treatment Patient Details Name: Raven Thompson MRN: [id number] DOB: 11/13/1946 Today's Date: 10/23/2021   History of Present Illness Pt is a 75 y/o female admitted 11/1 secondary to AMS. Found to have acute diverticulitis. PMH includes CAD, COPD, HTN, and PE.    PT Comments    Pt making good progress with mobility. Did better with rollator than rolling walker. At ALF would be beneficial to have rollator for use in hallways. Likely will need some cues to remember to use.    Recommendations for follow up therapy are one component of a multi-disciplinary discharge planning process, led by the attending physician.  Recommendations may be updated based on patient status, additional functional criteria and insurance authorization.  Follow Up Recommendations  Home health PT (at ALF)     Assistance Recommended at Discharge Intermittent Supervision/Assistance  Equipment Recommendations  Rollator (4 wheels)    Recommendations for Other Services       Precautions / Restrictions Precautions Precautions: Fall     Mobility  Bed Mobility Overal bed mobility: Needs Assistance Bed Mobility: Supine to Sit     Supine to sit: Min assist;HOB elevated     General bed mobility comments: Assist to elevate trunk into sitting    Transfers Overall transfer level: Needs assistance Equipment used: None   Sit to Stand: Supervision           General transfer comment: Able to come to sitting without use of hands    Ambulation/Gait Ambulation/Gait assistance: Min guard Gait Distance (Feet): 150 Feet Assistive device: Rollator (4 wheels);Rolling walker (2 wheels);None Gait Pattern/deviations: Step-through pattern;Decreased stride length Gait velocity: decr Gait velocity interpretation: 1.31 - 2.62 ft/sec, indicative of limited community ambulator General Gait Details: Assist for safety. Pt tried rolling walker but with increased effort to push. Used rollator in hallway and  much better use. In room pt tends to push assistive device to the side and ambulate without.   Stairs             Wheelchair Mobility    Modified Rankin (Stroke Patients Only)       Balance Overall balance assessment: Needs assistance Sitting-balance support: No upper extremity supported;Feet unsupported Sitting balance-Leahy Scale: Good     Standing balance support: No upper extremity supported Standing balance-Leahy Scale: Fair                              Cognition Arousal/Alertness: Awake/alert Behavior During Therapy: WFL for tasks assessed/performed Overall Cognitive Status: History of cognitive impairments - at baseline                                          Exercises      General Comments        Pertinent Vitals/Pain Pain Assessment: No/denies pain    Home Living                          Prior Function            PT Goals (current goals can now be found in the care plan section) Progress towards PT goals: Progressing toward goals    Frequency    Min 3X/week      PT Plan Current plan remains appropriate    Co-evaluation  AM-PAC PT "6 Clicks" Mobility   Outcome Measure  Help needed turning from your back to your side while in a flat bed without using bedrails?: A Little Help needed moving from lying on your back to sitting on the side of a flat bed without using bedrails?: A Little Help needed moving to and from a bed to a chair (including a wheelchair)?: A Little Help needed standing up from a chair using your arms (e.g., wheelchair or bedside chair)?: A Little Help needed to walk in hospital room?: A Little Help needed climbing 3-5 steps with a railing? : A Little 6 Click Score: 18    End of Session Equipment Utilized During Treatment: Gait belt Activity Tolerance: Patient tolerated treatment well Patient left: in chair;with call bell/phone within reach;with  family/visitor present Nurse Communication: Mobility status PT Visit Diagnosis: Unsteadiness on feet (R26.81);Muscle weakness (generalized) (M62.81);Difficulty in walking, not elsewhere classified (R26.2)     Time: 5183-4373 PT Time Calculation (min) (ACUTE ONLY): 19 min  Charges:  $Gait Training: 8-22 mins                     [place] Pager [phone] Office [place] 10/23/2021, 1:38 PM

## 2021-10-23 NOTE — Progress Notes (Signed)
PHARMACY - PHYSICIAN COMMUNICATION CRITICAL VALUE ALERT - BLOOD CULTURE IDENTIFICATION (BCID)  Raven Thompson is an 74 y.o. female who presented to [facility] on 10/21/2021 with a chief complaint of abdominal pain.   Assessment: 75 year old female admitted with abdominal pain found to have acute diverticulitis. Now with staph epi in 1/4 blood cultures. Likely a contaminant   Name of physician (or Provider) Contacted: Elgergawy   Current antibiotics: Zosyn   Changes to prescribed antibiotics recommended:  Patient is on recommended antibiotics - No changes needed  Results for orders placed or performed during the hospital encounter of 10/21/21  Blood Culture ID Panel (Reflexed) (Collected: 10/22/2021  2:52 AM)  Result Value Ref Range   Enterococcus faecalis NOT DETECTED NOT DETECTED   Enterococcus Faecium NOT DETECTED NOT DETECTED   Listeria monocytogenes NOT DETECTED NOT DETECTED   Staphylococcus species DETECTED (A) NOT DETECTED   Staphylococcus aureus (BCID) NOT DETECTED NOT DETECTED   Staphylococcus epidermidis DETECTED (A) NOT DETECTED   Staphylococcus lugdunensis NOT DETECTED NOT DETECTED   Streptococcus species NOT DETECTED NOT DETECTED   Streptococcus agalactiae NOT DETECTED NOT DETECTED   Streptococcus pneumoniae NOT DETECTED NOT DETECTED   Streptococcus pyogenes NOT DETECTED NOT DETECTED   A.calcoaceticus-baumannii NOT DETECTED NOT DETECTED   Bacteroides fragilis NOT DETECTED NOT DETECTED   Enterobacterales NOT DETECTED NOT DETECTED   Enterobacter cloacae complex NOT DETECTED NOT DETECTED   Escherichia coli NOT DETECTED NOT DETECTED   Klebsiella aerogenes NOT DETECTED NOT DETECTED   Klebsiella oxytoca NOT DETECTED NOT DETECTED   Klebsiella pneumoniae NOT DETECTED NOT DETECTED   Proteus species NOT DETECTED NOT DETECTED   Salmonella species NOT DETECTED NOT DETECTED   Serratia marcescens NOT DETECTED NOT DETECTED   Haemophilus influenzae NOT DETECTED NOT DETECTED    Neisseria meningitidis NOT DETECTED NOT DETECTED   Pseudomonas aeruginosa NOT DETECTED NOT DETECTED   Stenotrophomonas maltophilia NOT DETECTED NOT DETECTED   Candida albicans NOT DETECTED NOT DETECTED   Candida auris NOT DETECTED NOT DETECTED   Candida glabrata NOT DETECTED NOT DETECTED   Candida krusei NOT DETECTED NOT DETECTED   Candida parapsilosis NOT DETECTED NOT DETECTED   Candida tropicalis NOT DETECTED NOT DETECTED   Cryptococcus neoformans/gattii NOT DETECTED NOT DETECTED   Methicillin resistance mecA/C DETECTED (A) NOT DETECTED   Jimmy Footman, PharmD, BCPS, BCIDP Infectious Diseases Clinical Pharmacist Phone: [phone] 10/23/2021  11:35 AM

## 2021-10-23 NOTE — TOC Initial Note (Signed)
Transition of Care [facility]) - Initial/Assessment Note    Patient Details  Name: Raven Thompson MRN: [id number] Date of Birth: February 25, 1946  [facility] CM/SW Contact:    Emeterio Reeve, LCSW Phone Number: 10/23/2021, 2:54 PM  Clinical Narrative:                  CSW received SNF consult. CSW met with pt at bedside. CSW introduced self and explained role at the hospital. Pt reports that PTA she was living at [facility]. Pt and daughter state she was independent with mobility and ADL's.   CSW will continue to follow.  Expected Discharge Plan: [place] Barriers to Discharge: Continued Medical Work up   Patient Goals and CMS Choice Patient states their goals for this hospitalization and ongoing recovery are:: to get better CMS Medicare.gov Compare Post Acute Care list provided to:: Patient Choice offered to / list presented to : Patient  Expected Discharge Plan and Services Expected Discharge Plan: New Site       Living arrangements for the past 2 months: [place]                                      Prior Living Arrangements/Services Living arrangements for the past 2 months: [place] Lives with:: Facility Resident Patient language and need for interpreter reviewed:: Yes Do you feel safe going back to the place where you live?: Yes      Need for Family Participation in Patient Care: Yes (Comment) Care giver support system in place?: Yes (comment) Current home services: DME Criminal Activity/Legal Involvement Pertinent to Current Situation/Hospitalization: No - Comment as needed  Activities of Daily Living      Permission Sought/Granted Permission sought to share information with : Facility Art therapist granted to share information with : Yes, Verbal Permission Granted     Permission granted to share info w AGENCY: [place] [facility]/ [place] agencies         Emotional Assessment Appearance:: Appears stated age Attitude/Demeanor/Rapport: Engaged Affect (typically observed): Appropriate Orientation: : Oriented to Situation, Oriented to  Time, Oriented to Place, Oriented to Self Alcohol / Substance Use: Not Applicable Psych Involvement: No (comment)  Admission diagnosis:  SIRS (systemic inflammatory response syndrome) (HCC) [R65.10] Acute cystitis without hematuria [N30.00] Acute diverticulitis [K57.92] Patient Active Problem List   Diagnosis Date Noted   Acute diverticulitis 10/22/2021   UTI (urinary tract infection) 77/82/4235   Acute metabolic encephalopathy 36/14/4315   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   Acute head injury 08/12/2018   Pulmonary embolism ([place]) 08/12/2016   CAD (coronary artery disease) 08/05/2016   Essential hypertension 08/05/2016   Mild cognitive disorder 06/25/2016   Prolonged Q-T interval on ECG 06/23/2016   Vitamin B12 deficiency 06/23/2016   Congestion of nasal sinus 05/29/2016   Tick bite 05/29/2016   Paranoia ([place]) 05/28/2016   Abnormal mammogram of right breast 12/19/2015   Cataract 06/08/2014   PCP:  Ihor Austin Pharmacy:   [facility] [place] [place] [address] Phone: [phone] Fax: [phone]     Social Determinants of Health (SDOH) Interventions    Readmission Risk Interventions No flowsheet data found.

## 2021-10-23 NOTE — Care Management (Signed)
Patient from [facility]. Anticipated discharge in 2 days .  NCM called spoke to [place] at [facility]. They are unable to take patient back on a weekend day. They will provide HHPT/OT orders just need to be faxed to them with FL2 at fax [phone].   PT recommending rollator. Same ordered through [place]

## 2021-10-23 NOTE — Progress Notes (Signed)
Mobility Specialist Progress Note:   10/23/21 1500  Mobility  Activity Refused mobility  $Mobility charge 1 Mobility   Pt refused mobility d/t no specific reason. Will f/u tomorrow as schedule permits.   Nelta Numbers Mobility Specialist  Phone [phone]

## 2021-10-24 DIAGNOSIS — I10 Essential (primary) hypertension: Secondary | ICD-10-CM

## 2021-10-24 LAB — CBC WITH DIFFERENTIAL/PLATELET
Abs Immature Granulocytes: 0.02 10*3/uL (ref 0.00–0.07)
Basophils Absolute: 0 10*3/uL (ref 0.0–0.1)
Basophils Relative: 0 %
Eosinophils Absolute: 0 10*3/uL (ref 0.0–0.5)
Eosinophils Relative: 1 %
HCT: 36.2 % (ref 36.0–46.0)
Hemoglobin: 11.8 g/dL — ABNORMAL LOW (ref 12.0–15.0)
Immature Granulocytes: 0 %
Lymphocytes Relative: 15 %
Lymphs Abs: 1.2 10*3/uL (ref 0.7–4.0)
MCH: 28.8 pg (ref 26.0–34.0)
MCHC: 32.6 g/dL (ref 30.0–36.0)
MCV: 88.3 fL (ref 80.0–100.0)
Monocytes Absolute: 0.7 10*3/uL (ref 0.1–1.0)
Monocytes Relative: 9 %
Neutro Abs: 5.8 10*3/uL (ref 1.7–7.7)
Neutrophils Relative %: 75 %
Platelets: 194 10*3/uL (ref 150–400)
RBC: 4.1 MIL/uL (ref 3.87–5.11)
RDW: 13.6 % (ref 11.5–15.5)
WBC: 7.8 10*3/uL (ref 4.0–10.5)
nRBC: 0 % (ref 0.0–0.2)

## 2021-10-24 LAB — MAGNESIUM: Magnesium: 2.1 mg/dL (ref 1.7–2.4)

## 2021-10-24 LAB — COMPREHENSIVE METABOLIC PANEL
ALT: 27 U/L (ref 0–44)
AST: 21 U/L (ref 15–41)
Albumin: 2.5 g/dL — ABNORMAL LOW (ref 3.5–5.0)
Alkaline Phosphatase: 67 U/L (ref 38–126)
Anion gap: 8 (ref 5–15)
BUN: 6 mg/dL — ABNORMAL LOW (ref 8–23)
CO2: 24 mmol/L (ref 22–32)
Calcium: 8.7 mg/dL — ABNORMAL LOW (ref 8.9–10.3)
Chloride: 105 mmol/L (ref 98–111)
Creatinine, Ser: 0.78 mg/dL (ref 0.44–1.00)
GFR, Estimated: >60 mL/min (ref >60)
Glucose, Bld: 104 mg/dL — ABNORMAL HIGH (ref 70–99)
Potassium: 3.7 mmol/L (ref 3.5–5.1)
Sodium: 137 mmol/L (ref 135–145)
Total Bilirubin: 0.6 mg/dL (ref 0.3–1.2)
Total Protein: 6 g/dL — ABNORMAL LOW (ref 6.5–8.1)

## 2021-10-24 LAB — BRAIN NATRIURETIC PEPTIDE: B Natriuretic Peptide: 328.7 pg/mL — ABNORMAL HIGH (ref 0.0–100.0)

## 2021-10-24 LAB — C-REACTIVE PROTEIN: CRP: 17.9 mg/dL — ABNORMAL HIGH (ref <1.0)

## 2021-10-24 MED ORDER — SENNOSIDES-DOCUSATE SODIUM 8.6-50 MG PO TABS
2.0000 | ORAL_TABLET | Freq: Two times a day (BID) | ORAL | Status: AC
  Administered 2021-10-24 – 2021-10-25 (×3): 2 via ORAL
  Filled 2021-10-24 (×3): qty 2

## 2021-10-24 MED ORDER — ENSURE ENLIVE PO LIQD
237.0000 mL | Freq: Three times a day (TID) | ORAL | Status: DC
Start: 2021-10-24 — End: 2021-10-26
  Administered 2021-10-24 – 2021-10-25 (×5): 237 mL via ORAL

## 2021-10-24 NOTE — Progress Notes (Signed)
Patients daughters visiting today. Stated that patient would not need a bed at [facility] due to not able to do treatment at this time.

## 2021-10-24 NOTE — Progress Notes (Signed)
Occupational Therapy Treatment Patient Details Name: Raven Thompson MRN: [id number] DOB: [birth year] Today's Date: 10/24/2021   History of present illness Pt is a 75 y/o female admitted 11/1 secondary to [place]. Found to have acute diverticulitis. PMH includes CAD, COPD, HTN, and PE.   OT comments  Patient with good gains toward patient focused goals.  Patient with significant improvement to basic in room mobility, and stand tolerance/balance with stand grooming.  Patient has just finished washing ADL with nursing tech.  OT to continue efforts in the acute setting, but planned discharge in place for a return to her ALF.     Recommendations for follow up therapy are one component of a multi-disciplinary discharge planning process, led by the attending physician.  Recommendations may be updated based on patient status, additional functional criteria and insurance authorization.    Follow Up Recommendations  Home health OT    Assistance Recommended at Discharge Intermittent Supervision/Assistance  Equipment Recommendations       Recommendations for Other Services      Precautions / Restrictions Precautions Precautions: Fall Restrictions Weight Bearing Restrictions: No Other Position/Activity Restrictions: Recently diagnosed metastatic left shoulder melanoma.       Mobility Bed Mobility               General bed mobility comments: up in recliner    Transfers Overall transfer level: Needs assistance   Transfers: Sit to/from Stand Sit to Stand: Supervision                 Balance           Standing balance support: Reliant on assistive device for balance;Bilateral upper extremity supported Standing balance-Leahy Scale: Fair                             ADL either performed or assessed with clinical judgement   ADL       Grooming: Wash/dry hands;Wash/dry face;Set up;Standing                   Toilet Transfer: Set up;Rollator (4  wheels);Ambulation   Toileting- Clothing Manipulation and Hygiene: Supervision/safety;Sitting/lateral lean                                 Cognition Arousal/Alertness: Awake/alert Behavior During Therapy: WFL for tasks assessed/performed Overall Cognitive Status: History of cognitive impairments - at baseline                                                              Pertinent Vitals/ Pain       Pain Assessment: No/denies pain Pain Intervention(s): Monitored during session                                                          Frequency  Min 2X/week        Progress Toward Goals  OT Goals(current goals can now be found in the care plan section)  Progress towards OT goals: Progressing toward goals  Acute  Rehab OT Goals Patient Stated Goal: Go back home OT Goal Formulation: With patient Time For Goal Achievement: 11/05/21 Potential to Achieve Goals: Good  Plan Discharge plan remains appropriate    Co-evaluation                 AM-PAC OT "6 Clicks" Daily Activity     Outcome Measure   Help from another person eating meals?: None Help from another person taking care of personal grooming?: None Help from another person toileting, which includes using toliet, bedpan, or urinal?: A Little Help from another person bathing (including washing, rinsing, drying)?: A Lot Help from another person to put on and taking off regular upper body clothing?: None Help from another person to put on and taking off regular lower body clothing?: A Lot 6 Click Score: 19    End of Session Equipment Utilized During Treatment: Rollator (4 wheels)  OT Visit Diagnosis: Unsteadiness on feet (R26.81);Muscle weakness (generalized) (M62.81)   Activity Tolerance Patient tolerated treatment well   Patient Left in chair;with call bell/phone within reach;with chair alarm set   Nurse Communication          Time:  7517-0017 OT Time Calculation (min): 15 min  Charges: OT General Charges $OT Visit: 1 Visit OT Treatments $Self Care/Home Management : 8-22 mins  10/24/2021  RP, OTR/L  Acute Rehabilitation Services  Office:  [phone]   Metta Clines 10/24/2021, 1:54 PM

## 2021-10-24 NOTE — Progress Notes (Signed)
Mobility Specialist Progress Note:   10/24/21 1100  Mobility  Activity Ambulated in hall  Level of Assistance Contact guard assist, steadying assist  Assistive Device None  Distance Ambulated (ft) 240 ft  Mobility Ambulated with assistance in hallway  Mobility Response Tolerated well  Mobility performed by Mobility specialist  Bed Position Chair  $Mobility charge 1 Mobility   Pt asx during ambulation. Contact G during amb d/t minor unsteady gait.   Raven Thompson Mobility Specialist  Phone [phone]

## 2021-10-24 NOTE — Progress Notes (Signed)
PROGRESS NOTE                                                                                                                                                                                                             Patient Demographics:    Raven Thompson, is a 75 y.o. female, DOB - 27-Sep-1946, YIR:[id number]  Outpatient Primary MD for the patient is Ihor Austin    LOS - 2  Admit date - 10/21/2021    Chief complaint.  Confused at assisted living facility.      Brief Narrative (HPI from H&P)   Raven Thompson is a 75 y.o. female with medical history significant of CAD with stent, COPD, hypertension, PE in 2017, melanoma of left upper extremity presenting to the ED for evaluation of abdominal pain and altered mental status. In the ER diagnosed with acute metabolic encephalopathy due to acute diverticulitis and admitted to the hospital.   Subjective:    Raven Thompson today denies any complaints, no significant events overnight, she is tolerating her full liquid diet.  .     Assessment  & Plan :   Acute Metabolic Encephalopathy/acute diverticulitis-in a patient with underlying dementia due to acute diverticulitis - being treated with bowel rest, IV fluids and IV antibiotics.  CT scan reassuring as there is no abscess, continue to monitor of note patient has had no GI follow-up ever, will benefit from outpatient colonoscopy if appropriate, she is tolerating full liquid diet. -  will continue with IV antibiotics. -She is tolerating full liquid diet, but IRP continue to trend up, so we will hold on advancing, but I will add some Ensure . -Leukocytosis within normal limit, which is reassuring, abdominal exam is benign, which is reassuring as well .   Metastatic left shoulder melanoma.   -Recent diagnosis, being followed outpatient, plan for MRI brain with/without contrast this Saturday, family to discuss with primary  oncologist if needed to be done here at [facility], as well she was supposed to start nivolumab 11/2, I have informed daughter this has to hold for now till she is seen by her oncologist.  Underlying dementia.  At risk for delirium.  Minimize narcotics and benzodiazepine use.  Vitamin B12 deficiency.  Continue replacement.  Obesity - BMI 33- follow with PCP.  Condition - Extremely Guarded  Family Communication  :  daughter Raven Thompson by phone 10/23/21 - DNR  Code Status :  DNR  Consults  :  None  PUD Prophylaxis : PPI   Procedures  :     CT Head - Non acute  CT Abd - Pelvis - Acute sigmoid diverticulitis without evidence of perforation or abscess. Tiny umbilical hernia containing fat. Aortic Atherosclerosis       Disposition Plan  :    Status is: Observation  DVT Prophylaxis  :    enoxaparin (LOVENOX) injection 40 mg Start: 10/22/21 1000   Lab Results  Component Value Date   PLT 194 10/24/2021    Diet :  Diet Order             Diet full liquid Room service appropriate? Yes; Fluid consistency: Thin  Diet effective now                    Inpatient Medications  Scheduled Meds:  cholecalciferol  1,000 Units Oral Daily   docusate sodium  100 mg Oral BID   enoxaparin (LOVENOX) injection  40 mg Subcutaneous Q24H   pantoprazole  40 mg Oral Daily   vitamin B-12  500 mcg Oral Daily   Continuous Infusions:  piperacillin-tazobactam (ZOSYN)  IV 3.375 g (10/24/21 0854)   PRN Meds:.acetaminophen **OR** acetaminophen  Antibiotics  :    Anti-infectives (From admission, onward)    Start     Dose/Rate Route Frequency Ordered Stop   10/22/21 1000  piperacillin-tazobactam (ZOSYN) IVPB 3.375 g        3.375 g 12.5 mL/hr over 240 Minutes Intravenous Every 8 hours 10/22/21 0518     10/22/21 0300  piperacillin-tazobactam (ZOSYN) IVPB 3.375 g        3.375 g 100 mL/hr over 30 Minutes Intravenous  Once 10/22/21 0247 10/22/21 0410           Murice Barbar M.D on 10/24/2021 at 3:16 PM  To page go to [email or web address]   [facility] -  Office  [phone]  See all Orders from today for further details    Objective:   Vitals:   10/23/21 2129 10/24/21 0135 10/24/21 0538 10/24/21 0807  BP: 124/71 132/70 121/69 119/65  Pulse: 76 81 66 72  Resp: 18 18 18 17   Temp: 98.6 F (37 C) 99 F (37.2 C) 99.1 F (37.3 C) 99.5 F (37.5 C)  TempSrc: Oral Oral Oral Oral  SpO2: 97% 95% 96% 95%  Weight:   83 kg   Height:        Wt Readings from Last 3 Encounters:  10/24/21 83 kg  08/11/18 74.8 kg  03/13/18 79.4 kg     Intake/Output Summary (Last 24 hours) at 10/24/2021 1516 Last data filed at 10/24/2021 1200 Gross per 24 hour  Intake 1040 ml  Output --  Net 1040 ml     Physical Exam  Awake Alert, Oriented X 1, frail Symmetrical Chest wall movement, Good air movement bilaterally, CTAB RRR,No Gallops,Rubs or new Murmurs, No Parasternal Heave +ve B.Sounds, Abd Soft, No tenderness, No rebound - guarding or rigidity. No Cyanosis, Clubbing or edema, No new Rash or bruise          Data Review:    CBC Recent Labs  Lab 10/21/21 1501 10/23/21 0124 10/24/21 0155  WBC 8.9 8.9 7.8  HGB 13.2 11.7* 11.8*  HCT 40.4 35.5* 36.2  PLT 198 171 194  MCV 88.6 87.9 88.3  MCH  28.9 29.0 28.8  MCHC 32.7 33.0 32.6  RDW 13.5 13.6 13.6  LYMPHSABS 0.7 1.2 1.2  MONOABS 0.6 1.1* 0.7  EOSABS 0.0 0.0 0.0  BASOSABS 0.0 0.0 0.0    Recent Labs  Lab 10/21/21 1501 10/21/21 1518 10/22/21 0330 10/22/21 0738 10/23/21 0124 10/24/21 0155  NA 133*  --   --   --  136 137  K 3.8  --   --   --  3.9 3.7  CL 103  --   --   --  106 105  CO2 22  --   --   --  24 24  GLUCOSE 128*  --   --   --  101* 104*  BUN 14  --   --   --  7* 6*  CREATININE 0.83  --   --   --  0.75 0.78  CALCIUM 9.0  --   --   --  8.4* 8.7*  AST 31  --   --   --  20 21  ALT 30  --   --   --  24 27  ALKPHOS 85  --   --   --  66 67  BILITOT 0.4  --    --   --  0.6 0.6  ALBUMIN 3.2*  --   --   --  2.5* 2.5*  MG  --   --   --   --  2.1 2.1  CRP  --   --   --  12.5* 15.0* 17.9*  LATICACIDVEN  --  1.6  --   --   --   --   INR  --   --  1.2  --   --   --   BNP  --   --   --   --  296.4* 328.7*    ------------------------------------------------------------------------------------------------------------------ No results for input(s): CHOL, HDL, LDLCALC, TRIG, CHOLHDL, LDLDIRECT in the last 72 hours.  No results found for: HGBA1C ------------------------------------------------------------------------------------------------------------------ No results for input(s): TSH, T4TOTAL, T3FREE, THYROIDAB in the last 72 hours.  Invalid input(s): FREET3  Cardiac Enzymes No results for input(s): CKMB, TROPONINI, MYOGLOBIN in the last 168 hours.  Invalid input(s): CK ------------------------------------------------------------------------------------------------------------------    Component Value Date/Time   BNP 328.7 (H) 10/24/2021 0155      Radiology Reports DG Chest 1 View  Result Date: 10/22/2021 CLINICAL DATA:  Altered mental status EXAM: CHEST  1 VIEW COMPARISON:  02/21/2021 FINDINGS: The heart size and mediastinal contours are within normal limits. Both lungs are clear. The visualized skeletal structures are unremarkable. IMPRESSION: No active disease. Electronically Signed   By: Ulyses Jarred M.D.   On: 10/22/2021 03:09   CT Head Wo Contrast  Result Date: 10/21/2021 CLINICAL DATA:  Melanoma and new altered mental status EXAM: CT HEAD WITHOUT CONTRAST TECHNIQUE: Contiguous axial images were obtained from the base of the skull through the vertex without intravenous contrast. COMPARISON:  02/20/2021 FINDINGS: Brain: No evidence of acute infarction, hemorrhage, cerebral edema, mass, mass effect, or midline shift. Ventricles and sulci are within normal limits for age. No extra-axial fluid collection. Vascular: No hyperdense vessel or  unexpected calcification. Skull: Normal. Negative for fracture or focal lesion. Hyperostosis frontalis. Sinuses/Orbits: No acute finding. Other: Trace fluid in left mastoid air cells. IMPRESSION: No acute intracranial process. Electronically Signed   By: Merilyn Baba M.D.   On: 10/21/2021 16:31   CT ABDOMEN PELVIS W CONTRAST  Result Date: 10/21/2021 CLINICAL DATA:  Abdominal pain  and fever, history coronary artery disease post MI, hypertension, COPD EXAM: CT ABDOMEN AND PELVIS WITH CONTRAST TECHNIQUE: Multidetector CT imaging of the abdomen and pelvis was performed using the standard protocol following bolus administration of intravenous contrast. CONTRAST:  131mL OMNIPAQUE IOHEXOL 300 MG/ML  SOLN COMPARISON:  08/12/2016 FINDINGS: Lower chest: Minimal atelectasis at RIGHT lung base Hepatobiliary: Gallbladder and liver normal appearance Pancreas: Normal appearance Spleen: Normal appearance Adrenals/Urinary Tract: Tiny LEFT renal cyst. Adrenal glands, kidneys, ureters, and bladder otherwise normal appearance. Stomach/Bowel: Sigmoid diverticulosis with sigmoid wall thickening and pericolic inflammatory changes consistent with acute diverticulitis. No extraluminal gas or abscess. Normal appendix. Stomach and remaining bowel loops unremarkable. Vascular/Lymphatic: Atherosclerotic calcifications aorta, iliac arteries, visceral arteries, coronary arteries. Aorta normal caliber. No adenopathy. Reproductive: Uterus surgically absent. Normal appearing LEFT ovary. RIGHT ovary not visualized. Other: No free air or free fluid. Tiny umbilical hernia containing fat. Musculoskeletal: No acute osseous findings. IMPRESSION: Acute sigmoid diverticulitis without evidence of perforation or abscess. Tiny umbilical hernia containing fat. Aortic Atherosclerosis (ICD10-I70.0). Electronically Signed   By: Lavonia Dana M.D.   On: 10/21/2021 16:31

## 2021-10-25 LAB — CBC WITH DIFFERENTIAL/PLATELET
Abs Immature Granulocytes: 0.02 10*3/uL (ref 0.00–0.07)
Basophils Absolute: 0 10*3/uL (ref 0.0–0.1)
Basophils Relative: 0 %
Eosinophils Absolute: 0.1 10*3/uL (ref 0.0–0.5)
Eosinophils Relative: 1 %
HCT: 35.3 % — ABNORMAL LOW (ref 36.0–46.0)
Hemoglobin: 11.3 g/dL — ABNORMAL LOW (ref 12.0–15.0)
Immature Granulocytes: 0 %
Lymphocytes Relative: 17 %
Lymphs Abs: 1.2 10*3/uL (ref 0.7–4.0)
MCH: 28.5 pg (ref 26.0–34.0)
MCHC: 32 g/dL (ref 30.0–36.0)
MCV: 89.1 fL (ref 80.0–100.0)
Monocytes Absolute: 0.6 10*3/uL (ref 0.1–1.0)
Monocytes Relative: 9 %
Neutro Abs: 4.9 10*3/uL (ref 1.7–7.7)
Neutrophils Relative %: 73 %
Platelets: 193 10*3/uL (ref 150–400)
RBC: 3.96 MIL/uL (ref 3.87–5.11)
RDW: 13.5 % (ref 11.5–15.5)
WBC: 6.8 10*3/uL (ref 4.0–10.5)
nRBC: 0 % (ref 0.0–0.2)

## 2021-10-25 LAB — CULTURE, BLOOD (ROUTINE X 2): Special Requests: ADEQUATE

## 2021-10-25 LAB — MAGNESIUM: Magnesium: 2.3 mg/dL (ref 1.7–2.4)

## 2021-10-25 LAB — COMPREHENSIVE METABOLIC PANEL
ALT: 30 U/L (ref 0–44)
AST: 24 U/L (ref 15–41)
Albumin: 2.5 g/dL — ABNORMAL LOW (ref 3.5–5.0)
Alkaline Phosphatase: 72 U/L (ref 38–126)
Anion gap: 6 (ref 5–15)
BUN: 5 mg/dL — ABNORMAL LOW (ref 8–23)
CO2: 25 mmol/L (ref 22–32)
Calcium: 8.7 mg/dL — ABNORMAL LOW (ref 8.9–10.3)
Chloride: 106 mmol/L (ref 98–111)
Creatinine, Ser: 0.74 mg/dL (ref 0.44–1.00)
GFR, Estimated: >60 mL/min (ref >60)
Glucose, Bld: 106 mg/dL — ABNORMAL HIGH (ref 70–99)
Potassium: 4.1 mmol/L (ref 3.5–5.1)
Sodium: 137 mmol/L (ref 135–145)
Total Bilirubin: 0.5 mg/dL (ref 0.3–1.2)
Total Protein: 6.1 g/dL — ABNORMAL LOW (ref 6.5–8.1)

## 2021-10-25 LAB — C-REACTIVE PROTEIN: CRP: 11.3 mg/dL — ABNORMAL HIGH (ref <1.0)

## 2021-10-25 NOTE — Progress Notes (Signed)
TRH night cross cover note:  I was contacted by patient's RN asking if existing telemetry order can be discontinued.  Per RN, while telemetry order was placed on 10/22/2021, the pt has never been connected to telemetry during this hospitalization.   Per my chart review, including review of most recent TRH progress note, patient is here with acute diverticulitis that is being managed conservatively and appears to be improving clinically, noting that her full liquid diet was advanced to soft diet today.  VSS, including no hypotension or supplemental O2 requirements. I subsequently discontinued the telemetry order from 10/22/21.     Babs Bertin, DO Hospitalist

## 2021-10-25 NOTE — TOC Progression Note (Signed)
Transition of Care [facility]) - Progression Note    Patient Details  Name: Raven Thompson MRN: [id number] Date of Birth: [birth year]  Transition [facility]) CM/SW Contact  Elliot Gurney [place], [place] Phone Number: [phone] 10/25/2021, 7:54 PM  Clinical Narrative:    Phone call to patient's daughter [name] [phone] to discuss tentative discharge for tomorrow.Patient's daughter would like to transport patient to the [facility]. She would also like to be called when discharge is confirmed. Patient's daughter [name] would also like to be notified [phone]. They will be available by text before noon, and free to be contacted by phone after 12pm.  [place], LCSW Transition of Care [phone]    Expected Discharge Plan: [place] Barriers to Discharge: Continued Medical Work up  Expected Discharge Plan and Services Expected Discharge Plan: [place] arrangements for the past 2 months: [place]                                       Social Determinants of Health (SDOH) Interventions    Readmission Risk Interventions No flowsheet data found.

## 2021-10-25 NOTE — TOC Progression Note (Signed)
Transition of Care [facility]) - Progression Note    Patient Details  Name: Raven Thompson MRN: [id number] Date of Birth: [birth year]  Transition of Care [facility]) CM/SW Contact  Tymber Stallings, [place], [place] Phone [place] 10/25/2021, 3:54 PM  Clinical Narrative:     Phone call to Roselyn Reef, discussed tentative plan of discharge back to [facility] tomorrow.  Transition of Care to continue to follow  Expected Discharge Plan: [place] Barriers to Discharge: Continued Medical Work up  Expected Discharge Plan and Services Expected Discharge Plan: [place] arrangements for the past 2 months: [place]                                       Social Determinants of Health (SDOH) Interventions    Readmission Risk Interventions No flowsheet data found.

## 2021-10-25 NOTE — Progress Notes (Signed)
PROGRESS NOTE                                                                                                                                                                                                             Patient Demographics:    Raven Thompson, is a 75 y.o. female, DOB - [birth year], ZES:[id number]  Outpatient Primary MD for the patient is Ihor Austin    LOS - 3  Admit date - 10/21/2021    Chief complaint.  Confused at assisted living facility.      Brief Narrative (HPI from H&P)   Raven Thompson is a 75 y.o. female with medical history significant of CAD with stent, COPD, hypertension, PE in 2017, melanoma of left upper extremity presenting to the ED for evaluation of abdominal pain and altered mental status. In the ER diagnosed with acute metabolic encephalopathy due to acute diverticulitis and admitted to the hospital.   Subjective:    Raven Thompson today reports bowel movement yesterday, she denies any complaints today, she remains afebrile, tolerating her full liquid diet, advance to soft diet.      Assessment  & Plan :   Acute Metabolic Encephalopathy/acute diverticulitis-in a patient with underlying dementia due to acute diverticulitis - being treated with bowel rest, IV fluids and IV antibiotics.  CT scan reassuring as there is no abscess, continue to monitor of note patient has had no GI follow-up ever, will benefit from outpatient colonoscopy if appropriate, she is tolerating full liquid diet. -  will continue with IV antibiotics. -She is tolerating full liquid diet, no pain, no fever, no leukocytosis, CRP is finally trending down which is reassuring, I will transition her to soft diet.     Metastatic left shoulder melanoma.   -Recent diagnosis, being followed outpatient, plan for MRI brain with/without contrast this Saturday, family to discuss with primary oncologist if needed to be done  here at [facility], as well she was supposed to start nivolumab 11/2, I have informed daughter this has to hold for now till she is seen by her oncologist.  Underlying dementia.  At risk for delirium.  Minimize narcotics and benzodiazepine use.  Vitamin B12 deficiency.  Continue replacement.  Obesity - BMI 33- follow with PCP.        Condition - Extremely Guarded  Family  Communication  :  daughter  [name] by phone 10/25/2021    Code Status :  DNR  Consults  :  None  PUD Prophylaxis : PPI   Procedures  :     CT Head - Non acute  CT Abd - Pelvis - Acute sigmoid diverticulitis without evidence of perforation or abscess. Tiny umbilical hernia containing fat. Aortic Atherosclerosis       Disposition Plan  :    Status is: Observation  DVT Prophylaxis  :    enoxaparin (LOVENOX) injection 40 mg Start: 10/22/21 1000   Lab Results  Component Value Date   PLT 193 10/25/2021    Diet :  Diet Order             DIET DYS 3 Room service appropriate? Yes; Fluid consistency: Thin  Diet effective now                    Inpatient Medications  Scheduled Meds:  cholecalciferol  1,000 Units Oral Daily   docusate sodium  100 mg Oral BID   enoxaparin (LOVENOX) injection  40 mg Subcutaneous Q24H   feeding supplement  237 mL Oral TID BM   pantoprazole  40 mg Oral Daily   senna-docusate  2 tablet Oral BID   vitamin B-12  500 mcg Oral Daily   Continuous Infusions:  piperacillin-tazobactam (ZOSYN)  IV 3.375 g (10/25/21 1135)   PRN Meds:.acetaminophen **OR** acetaminophen  Antibiotics  :    Anti-infectives (From admission, onward)    Start     Dose/Rate Route Frequency Ordered Stop   10/22/21 1000  piperacillin-tazobactam (ZOSYN) IVPB 3.375 g        3.375 g 12.5 mL/hr over 240 Minutes Intravenous Every 8 hours 10/22/21 0518     10/22/21 0300  piperacillin-tazobactam (ZOSYN) IVPB 3.375 g        3.375 g 100 mL/hr over 30 Minutes Intravenous  Once  10/22/21 0247 10/22/21 0410          Lakiya Cottam M.D on 10/25/2021 at 2:58 PM  To page go to [email or web address]   [facility] -  Office  [phone]  See all Orders from today for further details    Objective:   Vitals:   10/24/21 2014 10/24/21 2355 10/25/21 0405 10/25/21 0759  BP: (!) 155/64 111/61 130/74 (!) 141/67  Pulse: 78 80 70 74  Resp: 19 18 16 20   Temp: 98.4 F (36.9 C) 99 F (37.2 C) 98.3 F (36.8 C) 98.1 F (36.7 C)  TempSrc: Oral Oral Oral Oral  SpO2: 97% 96% 97% 98%  Weight:      Height:        Wt Readings from Last 3 Encounters:  10/24/21 83 kg  08/11/18 74.8 kg  03/13/18 79.4 kg     Intake/Output Summary (Last 24 hours) at 10/25/2021 1458 Last data filed at 10/25/2021 6503 Gross per 24 hour  Intake 640 ml  Output 600 ml  Net 40 ml     Physical Exam  Awake Alert, Oriented X 1, No new F.N deficits Symmetrical Chest wall movement, Good air movement bilaterally, CTAB RRR,No Gallops,Rubs or new Murmurs, No Parasternal Heave +ve B.Sounds, Abd Soft, No tenderness, No rebound - guarding or rigidity. No Cyanosis, Clubbing or edema, No new Rash or bruise           Data Review:    CBC Recent Labs  Lab 10/21/21 1501 10/23/21 0124 10/24/21 0155 10/25/21 0136  WBC 8.9 8.9 7.8 6.8  HGB 13.2 11.7* 11.8* 11.3*  HCT 40.4 35.5* 36.2 35.3*  PLT 198 171 194 193  MCV 88.6 87.9 88.3 89.1  MCH 28.9 29.0 28.8 28.5  MCHC 32.7 33.0 32.6 32.0  RDW 13.5 13.6 13.6 13.5  LYMPHSABS 0.7 1.2 1.2 1.2  MONOABS 0.6 1.1* 0.7 0.6  EOSABS 0.0 0.0 0.0 0.1  BASOSABS 0.0 0.0 0.0 0.0    Recent Labs  Lab 10/21/21 1501 10/21/21 1518 10/22/21 0330 10/22/21 0738 10/23/21 0124 10/24/21 0155 10/25/21 0136  NA 133*  --   --   --  136 137 137  K 3.8  --   --   --  3.9 3.7 4.1  CL 103  --   --   --  106 105 106  CO2 22  --   --   --  24 24 25   GLUCOSE 128*  --   --   --  101* 104* 106*  BUN 14  --   --   --  7* 6* 5*  CREATININE 0.83  --   --   --   0.75 0.78 0.74  CALCIUM 9.0  --   --   --  8.4* 8.7* 8.7*  AST 31  --   --   --  20 21 24   ALT 30  --   --   --  24 27 30   ALKPHOS 85  --   --   --  66 67 72  BILITOT 0.4  --   --   --  0.6 0.6 0.5  ALBUMIN 3.2*  --   --   --  2.5* 2.5* 2.5*  MG  --   --   --   --  2.1 2.1 2.3  CRP  --   --   --  12.5* 15.0* 17.9* 11.3*  LATICACIDVEN  --  1.6  --   --   --   --   --   INR  --   --  1.2  --   --   --   --   BNP  --   --   --   --  296.4* 328.7*  --     ------------------------------------------------------------------------------------------------------------------ No results for input(s): CHOL, HDL, LDLCALC, TRIG, CHOLHDL, LDLDIRECT in the last 72 hours.  No results found for: HGBA1C ------------------------------------------------------------------------------------------------------------------ No results for input(s): TSH, T4TOTAL, T3FREE, THYROIDAB in the last 72 hours.  Invalid input(s): FREET3  Cardiac Enzymes No results for input(s): CKMB, TROPONINI, MYOGLOBIN in the last 168 hours.  Invalid input(s): CK ------------------------------------------------------------------------------------------------------------------    Component Value Date/Time   BNP 328.7 (H) 10/24/2021 0155      Radiology Reports DG Chest 1 View  Result Date: 10/22/2021 CLINICAL DATA:  Altered mental status EXAM: CHEST  1 VIEW COMPARISON:  02/21/2021 FINDINGS: The heart size and mediastinal contours are within normal limits. Both lungs are clear. The visualized skeletal structures are unremarkable. IMPRESSION: No active disease. Electronically Signed   By: Ulyses Jarred M.D.   On: 10/22/2021 03:09   CT Head Wo Contrast  Result Date: 10/21/2021 CLINICAL DATA:  Melanoma and new altered mental status EXAM: CT HEAD WITHOUT CONTRAST TECHNIQUE: Contiguous axial images were obtained from the base of the skull through the vertex without intravenous contrast. COMPARISON:  02/20/2021 FINDINGS: Brain: No  evidence of acute infarction, hemorrhage, cerebral edema, mass, mass effect, or midline shift. Ventricles and sulci are within normal limits for age. No extra-axial fluid collection. Vascular: No hyperdense vessel or unexpected  calcification. Skull: Normal. Negative for fracture or focal lesion. Hyperostosis frontalis. Sinuses/Orbits: No acute finding. Other: Trace fluid in left mastoid air cells. IMPRESSION: No acute intracranial process. Electronically Signed   By: Merilyn Baba M.D.   On: 10/21/2021 16:31   CT ABDOMEN PELVIS W CONTRAST  Result Date: 10/21/2021 CLINICAL DATA:  Abdominal pain and fever, history coronary artery disease post MI, hypertension, COPD EXAM: CT ABDOMEN AND PELVIS WITH CONTRAST TECHNIQUE: Multidetector CT imaging of the abdomen and pelvis was performed using the standard protocol following bolus administration of intravenous contrast. CONTRAST:  182mL OMNIPAQUE IOHEXOL 300 MG/ML  SOLN COMPARISON:  08/12/2016 FINDINGS: Lower chest: Minimal atelectasis at RIGHT lung base Hepatobiliary: Gallbladder and liver normal appearance Pancreas: Normal appearance Spleen: Normal appearance Adrenals/Urinary Tract: Tiny LEFT renal cyst. Adrenal glands, kidneys, ureters, and bladder otherwise normal appearance. Stomach/Bowel: Sigmoid diverticulosis with sigmoid wall thickening and pericolic inflammatory changes consistent with acute diverticulitis. No extraluminal gas or abscess. Normal appendix. Stomach and remaining bowel loops unremarkable. Vascular/Lymphatic: Atherosclerotic calcifications aorta, iliac arteries, visceral arteries, coronary arteries. Aorta normal caliber. No adenopathy. Reproductive: Uterus surgically absent. Normal appearing LEFT ovary. RIGHT ovary not visualized. Other: No free air or free fluid. Tiny umbilical hernia containing fat. Musculoskeletal: No acute osseous findings. IMPRESSION: Acute sigmoid diverticulitis without evidence of perforation or abscess. Tiny umbilical  hernia containing fat. Aortic Atherosclerosis (ICD10-I70.0). Electronically Signed   By: Lavonia Dana M.D.   On: 10/21/2021 16:31

## 2021-10-25 NOTE — Progress Notes (Signed)
Mobility Specialist Progress Note   10/25/21 1500  Mobility  Activity Ambulated in hall;Ambulated in room  Level of Assistance Contact guard assist, steadying assist  Assistive Device Front wheel walker  Distance Ambulated (ft) 180 ft  Mobility Ambulated with assistance in hallway;Ambulated with assistance in room  Mobility Response Tolerated well  Mobility performed by Mobility specialist  $Mobility charge 1 Mobility   Received pt walking in the hall w/ family, joined to assess mobility. Once back in the room further explained proper usage of a rollator and had pt mimic proper mechanics. Returned pt to chair w/ call bell by side and family in the room.     Holland Falling Mobility Specialist Phone Number [phone]

## 2021-10-26 ENCOUNTER — Encounter (HOSPITAL_COMMUNITY): Payer: Self-pay

## 2021-10-26 LAB — CBC WITH DIFFERENTIAL/PLATELET
Abs Immature Granulocytes: 0.05 10*3/uL (ref 0.00–0.07)
Basophils Absolute: 0 10*3/uL (ref 0.0–0.1)
Basophils Relative: 0 %
Eosinophils Absolute: 0 10*3/uL (ref 0.0–0.5)
Eosinophils Relative: 0 %
HCT: 41.3 % (ref 36.0–46.0)
Hemoglobin: 13.4 g/dL (ref 12.0–15.0)
Immature Granulocytes: 1 %
Lymphocytes Relative: 14 %
Lymphs Abs: 1.1 10*3/uL (ref 0.7–4.0)
MCH: 28.6 pg (ref 26.0–34.0)
MCHC: 32.4 g/dL (ref 30.0–36.0)
MCV: 88.2 fL (ref 80.0–100.0)
Monocytes Absolute: 0.5 10*3/uL (ref 0.1–1.0)
Monocytes Relative: 6 %
Neutro Abs: 6.5 10*3/uL (ref 1.7–7.7)
Neutrophils Relative %: 79 %
Platelets: 217 10*3/uL (ref 150–400)
RBC: 4.68 MIL/uL (ref 3.87–5.11)
RDW: 13.3 % (ref 11.5–15.5)
WBC: 8.2 10*3/uL (ref 4.0–10.5)
nRBC: 0 % (ref 0.0–0.2)

## 2021-10-26 LAB — COMPREHENSIVE METABOLIC PANEL
ALT: 34 U/L (ref 0–44)
AST: 24 U/L (ref 15–41)
Albumin: 3.2 g/dL — ABNORMAL LOW (ref 3.5–5.0)
Alkaline Phosphatase: 78 U/L (ref 38–126)
Anion gap: 9 (ref 5–15)
BUN: 7 mg/dL — ABNORMAL LOW (ref 8–23)
CO2: 22 mmol/L (ref 22–32)
Calcium: 9.5 mg/dL (ref 8.9–10.3)
Chloride: 108 mmol/L (ref 98–111)
Creatinine, Ser: 0.81 mg/dL (ref 0.44–1.00)
GFR, Estimated: >60 mL/min (ref >60)
Glucose, Bld: 119 mg/dL — ABNORMAL HIGH (ref 70–99)
Potassium: 4 mmol/L (ref 3.5–5.1)
Sodium: 139 mmol/L (ref 135–145)
Total Bilirubin: 0.2 mg/dL — ABNORMAL LOW (ref 0.3–1.2)
Total Protein: 7.4 g/dL (ref 6.5–8.1)

## 2021-10-26 LAB — C-REACTIVE PROTEIN: CRP: 6.5 mg/dL — ABNORMAL HIGH (ref <1.0)

## 2021-10-26 LAB — RESP PANEL BY RT-PCR (FLU A&B, COVID) ARPGX2
Influenza A by PCR: NEGATIVE
Influenza B by PCR: NEGATIVE
SARS Coronavirus 2 by RT PCR: NEGATIVE

## 2021-10-26 LAB — CULTURE, BLOOD (ROUTINE X 2)
Culture: NO GROWTH
Culture: NO GROWTH
Special Requests: ADEQUATE

## 2021-10-26 LAB — MAGNESIUM: Magnesium: 2.2 mg/dL (ref 1.7–2.4)

## 2021-10-26 MED ORDER — AMOXICILLIN-POT CLAVULANATE 875-125 MG PO TABS: 1.0000 | ORAL_TABLET | Freq: Two times a day (BID) | ORAL | 0 refills | Status: AC

## 2021-10-26 NOTE — TOC Transition Note (Signed)
Transition of Care [facility]) - CM/SW Discharge Note   Patient Details  Name: Raven Thompson MRN: [id number] Date of Birth: 01/07/1946  Transition of Care [facility]) CM/SW Contact:  Milas Gain, [place] Phone Number: 10/26/2021, 11:55 AM   Clinical Narrative:     Patient will DC to: [facility]   Anticipated DC date: 10/26/2021  Family notified: Deanna and [name]   Transport by: Patients daughter   ?  Per MD patient ready for DC to [facility] . RN, patient, patient's family, and facility notified of DC. Discharge [facility] orders, Covid result sent to facility. RN given number for report tele# [phone] RM# [phone] ask for [facility]. DC packet on chart. DNR signed by MD attached to patients DC packet.Patients daughter to transport patient back to [facility].  CSW signing off.   Final next level of care: Assisted Living [facility]) Barriers to Discharge: No Barriers Identified   Patient Goals and CMS Choice Patient states their goals for this hospitalization and ongoing recovery are:: ALF CMS Medicare.gov Compare Post Acute Care list provided to:: Patient Choice offered to / list presented to : Patient  Discharge Placement              Patient chooses bed at: [facility] (ALF) Patient to be transferred to facility by: Patients daughter Name of family member notified: Deanna and Becky Patient and family notified of of transfer: 10/26/21  Discharge Plan and Services                                     Social Determinants of Health (SDOH) Interventions     Readmission Risk Interventions No flowsheet data found.

## 2021-10-26 NOTE — NC FL2 (Addendum)
[place] LEVEL OF CARE SCREENING TOOL     IDENTIFICATION  Patient Name: Raven Thompson Birthdate: 17-Dec-1946 Sex: female Admission Date (Current Location): 10/21/2021  [facility] and [place] Number:  Herbalist and Address:  [facility] [place] [address], [place], [place] [address]      Provider Number: [id number]  Attending Physician Name and Address:  Albertine Patricia, MD  Relative Name and Phone Number:  [name] [phone]    Current Level of Care: Hospital Recommended Level of Care: [place] Prior Approval Number:    Date Approved/Denied:   PASRR Number:    Discharge Plan: Other (Comment) ([facility])    Current Diagnoses: Patient Active Problem List   Diagnosis Date Noted   Acute diverticulitis 10/22/2021   UTI (urinary tract infection) 18/84/1660   Acute metabolic encephalopathy 63/12/6008   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   Acute head injury 08/12/2018   Pulmonary embolism ([place]) 08/12/2016   CAD (coronary artery disease) 08/05/2016   Essential hypertension 08/05/2016   Mild cognitive disorder 06/25/2016   Prolonged Q-T interval on ECG 06/23/2016   Vitamin B12 deficiency 06/23/2016   Congestion of nasal sinus 05/29/2016   Tick bite 05/29/2016   Paranoia ([place]) 05/28/2016   Abnormal mammogram of right breast 12/19/2015   Cataract 06/08/2014    Orientation RESPIRATION BLADDER Height & Weight     Self  Normal Continent, External catheter (External Urinary Catheter) Weight: 182 lb 15.7 oz (83 kg) Height:  5\' 3"  (160 cm)  BEHAVIORAL SYMPTOMS/MOOD NEUROLOGICAL BOWEL NUTRITION STATUS      Continent Diet: Regular   AMBULATORY STATUS COMMUNICATION OF NEEDS Skin   Supervision Verbally Normal                       Personal Care Assistance Level of Assistance  Bathing, Feeding, Dressing Bathing Assistance: Maximum assistance Feeding  Assistance: Maximum assistance  Dressing Assistance: Maximum assistance     Functional Limitations Info  Sight, Hearing, Speech Sight Info: Adequate (WDL) Hearing Info: Adequate (WDL) Speech Info: Adequate (WDL)    [place]  PT (By licensed PT), OT (By licensed OT)     PT Frequency: 3x min weekly OT Frequency: 3x min weekly            Contractures Contractures Info: Not present    Additional Factors Info  Code Status, Allergies Code Status Info: DNR Allergies Info: Sulfa Antibiotics           Current Medications (10/26/2021):  This is the current hospital active medication list Current Facility-Administered Medications  Medication Dose Route Frequency Provider Last Rate Last Admin   acetaminophen (TYLENOL) tablet 650 mg  650 mg Oral Q6H PRN Shela Leff, MD   650 mg at 10/25/21 1634   Or   acetaminophen (TYLENOL) suppository 650 mg  650 mg Rectal Q6H PRN Shela Leff, MD       cholecalciferol (VITAMIN D3) tablet 1,000 Units  1,000 Units Oral Daily Thurnell Lose, MD   1,000 Units at 10/26/21 0810   enoxaparin (LOVENOX) injection 40 mg  40 mg Subcutaneous Q24H Shela Leff, MD   40 mg at 10/25/21 1854   feeding supplement (ENSURE ENLIVE / ENSURE PLUS) liquid 237 mL  237 mL Oral TID BM Elgergawy, Silver Huguenin, MD   237 mL at 10/25/21 2037   pantoprazole (PROTONIX) EC tablet 40 mg  40 mg Oral  Daily Thurnell Lose, MD   40 mg at 10/26/21 0810   piperacillin-tazobactam (ZOSYN) IVPB 3.375 g  3.375 g Intravenous Q8H Laren Everts, RPH 12.5 mL/hr at 10/26/21 0350 3.375 g at 10/26/21 0350   vitamin B-12 (CYANOCOBALAMIN) tablet 500 mcg  500 mcg Oral Daily Thurnell Lose, MD   500 mcg at 10/26/21 0375     Discharge Medications: Please see discharge summary for a list of discharge medications.  Relevant Imaging Results:  Relevant Lab Results:   Additional Information SSN-[id number]  Milas Gain, LCSWA

## 2021-10-26 NOTE — Discharge Summary (Signed)
Physician Discharge Summary  Raven Thompson EYC:[id number] DOB: 1946/12/11 DOA: 10/21/2021  PCP: Ihor Austin  Admit date: 10/21/2021 Discharge date: 10/26/2021  Admitted From:  ALF Disposition:  ALF  Recommendations for Outpatient Follow-up:  Follow up with PCP in 1week Please obtain BMP/CBC in one week please avoid constipation  Home Health:YES  Discharge Condition:Stable CODE STATUS:DNR Diet recommendation: Regular   Brief/Interim Summary:  Raven Thompson is a 75 y.o. female with medical history significant of CAD with stent, COPD, hypertension, PE in 2017, melanoma of left upper extremity presenting to the ED for evaluation of abdominal pain and altered mental status. In the ER diagnosed with acute metabolic encephalopathy due to acute diverticulitis and admitted to the hospital.    Acute Metabolic Encephalopathy/acute diverticulitis-in a patient with underlying dementia due to acute diverticulitis - being treated with bowel rest, IV fluids and IV antibiotics.  CT scan reassuring as there is no abscess, she is with significant fever initially, she was treated with IV Zosyn during hospital stay, no leukocytosis, no fever, tolerating oral intake, CRP was elevated, ending up initially, but it is trending down, almost normalized, he was advanced to regular diet, which she is tolerating, so patient will be discharged on oral Augmentin to finish another 6 days as an outpatient for total of 10 days treatment.  . -Avoid constipation with as needed medication she is having diarrhea at time of discharge given she received laxatives yesterday, so we will hold on initiating any scheduled stool softener for now.  But it might be needed as an outpatient.  .   Metastatic left shoulder melanoma.   -Recent diagnosis, being followed outpatient, plan for MRI brain with/without contrast this Saturday, family to discuss with primary oncologist if needed to be done here at [facility], as well she was supposed to start nivolumab 11/2, I have informed daughter this has to hold for now till she is seen by her oncologist.   Underlying dementia.  At risk for delirium.   Vitamin B12 deficiency.  Continue replacement.   Obesity - BMI 33- follow with PCP.    Discharge Diagnoses:  Principal Problem:   Acute diverticulitis Active Problems:   CAD (coronary artery disease)   Essential hypertension   UTI (urinary tract infection)   Acute metabolic encephalopathy    Discharge Instructions  Discharge Instructions     Diet - low sodium heart healthy   Complete by: As directed    Discharge instructions   Complete by: As directed    Follow with Primary MD Ihor Austin in 7 days   Get CBC, CMP, checked  by Primary MD next visit.    Activity: As tolerated with Full fall precautions use walker/cane & assistance as needed   Disposition ALF   Diet: Regular diet   On your next visit with your primary care physician please Get Medicines reviewed and adjusted.   Please request your Prim.MD to go over all Hospital Tests and Procedure/Radiological results at the follow up, please get all Hospital records sent to your Prim MD by signing hospital release before you go home.   If you experience worsening of your admission symptoms, develop shortness of breath, life threatening emergency, suicidal or homicidal thoughts you must seek medical attention immediately by calling 911 or calling your MD immediately  if symptoms less severe.  You Must read complete instructions/literature along with all the possible adverse reactions/side effects for all the Medicines you take and that have  been prescribed to you. Take any new Medicines after you have completely understood and accpet all the possible adverse reactions/side effects.   Do not drive, operating heavy machinery, perform activities at heights, swimming or participation in water activities or provide baby  sitting services if your were admitted for syncope or siezures until you have seen by Primary MD or a Neurologist and advised to do so again.  Do not drive when taking Pain medications.    Do not take more than prescribed Pain, Sleep and Anxiety Medications  Special Instructions: If you have smoked or chewed Tobacco  in the last 2 yrs please stop smoking, stop any regular Alcohol  and or any Recreational drug use.  Wear Seat belts while driving.   Please note  You were cared for by a hospitalist during your hospital stay. If you have any questions about your discharge medications or the care you received while you were in the hospital after you are discharged, you can call the unit and asked to speak with the hospitalist on call if the hospitalist that took care of you is not available. Once you are discharged, your primary care physician will handle any further medical issues. Please note that NO REFILLS for any discharge medications will be authorized once you are discharged, as it is imperative that you return to your primary care physician (or establish a relationship with a primary care physician if you do not have one) for your aftercare needs so that they can reassess your need for medications and monitor your lab values.   Increase activity slowly   Complete by: As directed       Allergies as of 10/26/2021       Reactions   Sulfa Antibiotics Nausea Only        Medication List     STOP taking these medications    benzocaine 10 % mucosal gel Commonly known as: ORAJEL   diphenhydramine-acetaminophen 25-500 MG Tabs tablet Commonly known as: TYLENOL PM   Gly-Oxide 10 % solution Generic drug: carbamide peroxide   Rivaroxaban Stater Pack (15 mg and 20 mg) Commonly known as: XARELTO STARTER PACK       TAKE these medications    acetaminophen 650 MG CR tablet Commonly known as: TYLENOL Take 650 mg by mouth every 8 (eight) hours as needed for pain.   alum & mag  hydroxide-simeth 200-200-20 MG/5ML suspension Commonly known as: MAALOX/MYLANTA Take 30 mLs by mouth every 6 (six) hours as needed for indigestion.   amoxicillin-clavulanate 875-125 MG tablet Commonly known as: Augmentin Take 1 tablet by mouth 2 (two) times daily for 6 days. Start taking on: October 27, 2021   Biotin 1 MG Caps Take 1 mg by mouth daily.   cholecalciferol 25 MCG (1000 UNIT) tablet Commonly known as: VITAMIN D3 Take 1,000 Units by mouth daily.   guaiFENesin 100 MG/5ML liquid Commonly known as: ROBITUSSIN Take 10 mLs by mouth every 6 (six) hours as needed for cough.   loperamide 2 MG capsule Commonly known as: IMODIUM Take 2 mg by mouth as needed for diarrhea or loose stools (max 8 doses in 24 hours).   magnesium hydroxide 400 MG/5ML suspension Commonly known as: MILK OF MAGNESIA Take 30 mLs by mouth at bedtime as needed for mild constipation.   Melatonin 10 MG Caps Take 10 mg by mouth at bedtime.   neomycin-bacitracin-polymyxin [phone] ointment Apply 1 application topically daily as needed (apply daily as needed for minor skin tears or abrasions).  Vitamin B-12 500 MCG Lozg Take 500 mcg by mouth daily.               Durable Medical Equipment  (From admission, onward)           Start     Ordered   10/23/21 1423  For home use only DME 4 wheeled rolling walker with seat  Once       Question:  Patient needs a walker to treat with the following condition  Answer:  Weakness   10/23/21 1423            Allergies  Allergen Reactions   Sulfa Antibiotics Nausea Only    Consultations: NONE   Procedures/Studies: DG Chest 1 View  Result Date: 10/22/2021 CLINICAL DATA:  Altered mental status EXAM: CHEST  1 VIEW COMPARISON:  02/21/2021 FINDINGS: The heart size and mediastinal contours are within normal limits. Both lungs are clear. The visualized skeletal structures are unremarkable. IMPRESSION: No active disease. Electronically Signed    By: Ulyses Jarred M.D.   On: 10/22/2021 03:09   CT Head Wo Contrast  Result Date: 10/21/2021 CLINICAL DATA:  Melanoma and new altered mental status EXAM: CT HEAD WITHOUT CONTRAST TECHNIQUE: Contiguous axial images were obtained from the base of the skull through the vertex without intravenous contrast. COMPARISON:  02/20/2021 FINDINGS: Brain: No evidence of acute infarction, hemorrhage, cerebral edema, mass, mass effect, or midline shift. Ventricles and sulci are within normal limits for age. No extra-axial fluid collection. Vascular: No hyperdense vessel or unexpected calcification. Skull: Normal. Negative for fracture or focal lesion. Hyperostosis frontalis. Sinuses/Orbits: No acute finding. Other: Trace fluid in left mastoid air cells. IMPRESSION: No acute intracranial process. Electronically Signed   By: Merilyn Baba M.D.   On: 10/21/2021 16:31   CT ABDOMEN PELVIS W CONTRAST  Result Date: 10/21/2021 CLINICAL DATA:  Abdominal pain and fever, history coronary artery disease post MI, hypertension, COPD EXAM: CT ABDOMEN AND PELVIS WITH CONTRAST TECHNIQUE: Multidetector CT imaging of the abdomen and pelvis was performed using the standard protocol following bolus administration of intravenous contrast. CONTRAST:  126mL OMNIPAQUE IOHEXOL 300 MG/ML  SOLN COMPARISON:  08/12/2016 FINDINGS: Lower chest: Minimal atelectasis at RIGHT lung base Hepatobiliary: Gallbladder and liver normal appearance Pancreas: Normal appearance Spleen: Normal appearance Adrenals/Urinary Tract: Tiny LEFT renal cyst. Adrenal glands, kidneys, ureters, and bladder otherwise normal appearance. Stomach/Bowel: Sigmoid diverticulosis with sigmoid wall thickening and pericolic inflammatory changes consistent with acute diverticulitis. No extraluminal gas or abscess. Normal appendix. Stomach and remaining bowel loops unremarkable. Vascular/Lymphatic: Atherosclerotic calcifications aorta, iliac arteries, visceral arteries, coronary arteries.  Aorta normal caliber. No adenopathy. Reproductive: Uterus surgically absent. Normal appearing LEFT ovary. RIGHT ovary not visualized. Other: No free air or free fluid. Tiny umbilical hernia containing fat. Musculoskeletal: No acute osseous findings. IMPRESSION: Acute sigmoid diverticulitis without evidence of perforation or abscess. Tiny umbilical hernia containing fat. Aortic Atherosclerosis (ICD10-I70.0). Electronically Signed   By: Lavonia Dana M.D.   On: 10/21/2021 16:31      Subjective:  Patient herself denies any complaints today, she reports 2 bowel movements yesterday after being started on laxatives, she had loose bowel movement today, she denies nausea, vomiting or abdominal pain. Discharge Exam: Vitals:   10/26/21 0544 10/26/21 0800  BP: (!) 144/76 (!) 142/72  Pulse: 74 82  Resp:  17  Temp: 98.2 F (36.8 C) 98.7 F (37.1 C)  SpO2: 91% 96%   Vitals:   10/25/21 1609 10/25/21 1942 10/26/21 0544 10/26/21 0800  BP: 128/73 129/65 (!) 144/76 (!) 142/72  Pulse: 76 76 74 82  Resp: 16 16  17   Temp: 98.1 F (36.7 C) 98.4 F (36.9 C) 98.2 F (36.8 C) 98.7 F (37.1 C)  TempSrc: Oral Oral Oral Oral  SpO2: 98% 96% 91% 96%  Weight:      Height:        General: Pt is a awake, in no apparent distress, she is with dementia at baseline, oriented x1. Cardiovascular: RRR, S1/S2 +, no rubs, no gallops Respiratory: CTA bilaterally, no wheezing, no rhonchi Abdominal: Soft, NT, ND, bowel sounds + Extremities: no edema, no cyanosis    The results of significant diagnostics from this hospitalization (including imaging, microbiology, ancillary and laboratory) are listed below for reference.     Microbiology: Recent Results (from the past 240 hour(s))  Culture, blood (routine x 2)     Status: None   Collection Time: 10/21/21  3:03 PM   Specimen: BLOOD RIGHT ARM  Result Value Ref Range Status   Specimen Description BLOOD RIGHT ARM  Final   Special Requests   Final    BOTTLES DRAWN  AEROBIC AND ANAEROBIC Blood Culture adequate volume   Culture   Final    NO GROWTH 5 DAYS Performed at [facility], [address] [place], [place] [address]    Report Status 10/26/2021 FINAL  Final  Culture, blood (routine x 2)     Status: None   Collection Time: 10/21/21  3:05 PM   Specimen: BLOOD LEFT HAND  Result Value Ref Range Status   Specimen Description BLOOD LEFT HAND  Final   Special Requests   Final    BOTTLES DRAWN AEROBIC ONLY Blood Culture results may not be optimal due to an inadequate volume of blood received in culture bottles   Culture   Final    NO GROWTH 5 DAYS Performed at [facility], [place] [address], [place], [place] [address]    Report Status 10/26/2021 FINAL  Final  Resp Panel by RT-PCR (Flu A&B, Covid) Nasopharyngeal Swab     Status: None   Collection Time: 10/22/21  2:47 AM   Specimen: Nasopharyngeal Swab; Nasopharyngeal(NP) swabs in vial transport medium  Result Value Ref Range Status   SARS Coronavirus 2 by RT PCR NEGATIVE NEGATIVE Final    Comment: (NOTE) SARS-CoV-2 target nucleic acids are NOT DETECTED.  The SARS-CoV-2 RNA is generally detectable in upper respiratory specimens during the acute phase of infection. The lowest concentration of SARS-CoV-2 viral copies this assay can detect is 138 copies/mL. A negative result does not preclude SARS-Cov-2 infection and should not be used as the sole basis for treatment or other patient management decisions. A negative result may occur with  improper specimen collection/handling, submission of specimen other than nasopharyngeal swab, presence of viral mutation(s) within the areas targeted by this assay, and inadequate number of viral copies(<138 copies/mL). A negative result must be combined with clinical observations, patient history, and epidemiological information. The expected result is Negative.  Fact Sheet for Patients:  EntrepreneurPulse.com.au  Fact Sheet for  Healthcare Providers:  IncredibleEmployment.be  This test is no t yet approved or cleared by the [place] FDA and  has been authorized for detection and/or diagnosis of SARS-CoV-2 by FDA under an Emergency Use Authorization (EUA). This EUA will remain  in effect (meaning this test can be used) for the duration of the COVID-19 declaration under Section 564(b)(1) of the Act, 21 U.S.C.section 360bbb-3(b)(1), unless the  authorization is terminated  or revoked sooner.       Influenza A by PCR NEGATIVE NEGATIVE Final   Influenza B by PCR NEGATIVE NEGATIVE Final    Comment: (NOTE) The Xpert Xpress SARS-CoV-2/FLU/RSV plus assay is intended as an aid in the diagnosis of influenza from Nasopharyngeal swab specimens and should not be used as a sole basis for treatment. Nasal washings and aspirates are unacceptable for Xpert Xpress SARS-CoV-2/FLU/RSV testing.  Fact Sheet for Patients: EntrepreneurPulse.com.au  Fact Sheet for Healthcare Providers: IncredibleEmployment.be  This test is not yet approved or cleared by the [place] FDA and has been authorized for detection and/or diagnosis of SARS-CoV-2 by FDA under an Emergency Use Authorization (EUA). This EUA will remain in effect (meaning this test can be used) for the duration of the COVID-19 declaration under Section 564(b)(1) of the Act, 21 U.S.C. section 360bbb-3(b)(1), unless the authorization is terminated or revoked.  Performed at [facility], [place] [address] [place], [place] [address]   Urine Culture     Status: Abnormal   Collection Time: 10/22/21  2:47 AM   Specimen: Urine, Clean Catch  Result Value Ref Range Status   Specimen Description URINE, CLEAN CATCH  Final   Special Requests Normal  Final   Culture (A)  Final    <10,000 COLONIES/mL INSIGNIFICANT GROWTH Performed at [facility], [place] [address] [place], [place] [address]    Report Status  10/22/2021 FINAL  Final  Blood Culture (routine x 2)     Status: Abnormal   Collection Time: 10/22/21  2:52 AM   Specimen: BLOOD RIGHT FOREARM  Result Value Ref Range Status   Specimen Description BLOOD RIGHT FOREARM  Final   Special Requests   Final    BOTTLES DRAWN AEROBIC AND ANAEROBIC Blood Culture adequate volume   Culture  Setup Time   Final    GRAM POSITIVE COCCI IN CLUSTERS ANAEROBIC BOTTLE ONLY CRITICAL RESULT CALLED TO, READ BACK BY AND VERIFIED WITH: E. SINCLAIR PHARMD, AT 1120 10/23/21 D. VANHOOK    Culture (A)  Final    STAPHYLOCOCCUS EPIDERMIDIS THE SIGNIFICANCE OF ISOLATING THIS ORGANISM FROM A SINGLE SET OF BLOOD CULTURES WHEN MULTIPLE SETS ARE DRAWN IS UNCERTAIN. PLEASE NOTIFY THE MICROBIOLOGY DEPARTMENT WITHIN ONE WEEK IF SPECIATION AND SENSITIVITIES ARE REQUIRED. Performed at [facility], [place] [address], [place], [place] [address]    Report Status 10/25/2021 FINAL  Final  Blood Culture ID Panel (Reflexed)     Status: Abnormal   Collection Time: 10/22/21  2:52 AM  Result Value Ref Range Status   Enterococcus faecalis NOT DETECTED NOT DETECTED Final   Enterococcus Faecium NOT DETECTED NOT DETECTED Final   Listeria monocytogenes NOT DETECTED NOT DETECTED Final   Staphylococcus species DETECTED (A) NOT DETECTED Final    Comment: CRITICAL RESULT CALLED TO, READ BACK BY AND VERIFIED WITH: E. SINCLAIR PHARMD, AT 1120 10/23/21 D. VANHOOK    Staphylococcus aureus (BCID) NOT DETECTED NOT DETECTED Final   Staphylococcus epidermidis DETECTED (A) NOT DETECTED Final    Comment: Methicillin (oxacillin) resistant coagulase negative staphylococcus. Possible blood culture contaminant (unless isolated from more than one blood culture draw or clinical case suggests pathogenicity). No antibiotic treatment is indicated for blood  culture contaminants. CRITICAL RESULT CALLED TO, READ BACK BY AND VERIFIED WITH: E. SINCLAIR PHARMD, AT 1120 10/23/21 D. VANHOOK    Staphylococcus  lugdunensis NOT DETECTED NOT DETECTED Final   Streptococcus species NOT DETECTED NOT DETECTED Final  Streptococcus agalactiae NOT DETECTED NOT DETECTED Final   Streptococcus pneumoniae NOT DETECTED NOT DETECTED Final   Streptococcus pyogenes NOT DETECTED NOT DETECTED Final   A.calcoaceticus-baumannii NOT DETECTED NOT DETECTED Final   Bacteroides fragilis NOT DETECTED NOT DETECTED Final   Enterobacterales NOT DETECTED NOT DETECTED Final   Enterobacter cloacae complex NOT DETECTED NOT DETECTED Final   Escherichia coli NOT DETECTED NOT DETECTED Final   Klebsiella aerogenes NOT DETECTED NOT DETECTED Final   Klebsiella oxytoca NOT DETECTED NOT DETECTED Final   Klebsiella pneumoniae NOT DETECTED NOT DETECTED Final   Proteus species NOT DETECTED NOT DETECTED Final   Salmonella species NOT DETECTED NOT DETECTED Final   Serratia marcescens NOT DETECTED NOT DETECTED Final   Haemophilus influenzae NOT DETECTED NOT DETECTED Final   Neisseria meningitidis NOT DETECTED NOT DETECTED Final   Pseudomonas aeruginosa NOT DETECTED NOT DETECTED Final   Stenotrophomonas maltophilia NOT DETECTED NOT DETECTED Final   Candida albicans NOT DETECTED NOT DETECTED Final   Candida auris NOT DETECTED NOT DETECTED Final   Candida glabrata NOT DETECTED NOT DETECTED Final   Candida krusei NOT DETECTED NOT DETECTED Final   Candida parapsilosis NOT DETECTED NOT DETECTED Final   Candida tropicalis NOT DETECTED NOT DETECTED Final   Cryptococcus neoformans/gattii NOT DETECTED NOT DETECTED Final   Methicillin resistance mecA/C DETECTED (A) NOT DETECTED Final    Comment: CRITICAL RESULT CALLED TO, READ BACK BY AND VERIFIED WITH: Ezekiel Slocumb PHARMD, AT 1120 10/23/21 D. Victoriano Lain Performed at [facility], [address] [place], [place] [address]   Blood Culture (routine x 2)     Status: None (Preliminary result)   Collection Time: 10/22/21  3:00 AM   Specimen: BLOOD  Result Value Ref Range Status   Specimen  Description BLOOD RIGHT ANTECUBITAL  Final   Special Requests   Final    BOTTLES DRAWN AEROBIC AND ANAEROBIC Blood Culture adequate volume   Culture   Final    NO GROWTH 4 DAYS Performed at [facility], [place] [address], [place], [place] [address]    Report Status PENDING  Incomplete     Labs: BNP (last 3 results) Recent Labs    10/23/21 0124 10/24/21 0155  BNP 296.4* 952.8*   Basic Metabolic Panel: Recent Labs  Lab 10/21/21 1501 10/23/21 0124 10/24/21 0155 10/25/21 0136 10/26/21 0714  NA 133* 136 137 137 139  K 3.8 3.9 3.7 4.1 4.0  CL 103 106 105 106 108  CO2 22 24 24 25 22   GLUCOSE 128* 101* 104* 106* 119*  BUN 14 7* 6* 5* 7*  CREATININE 0.83 0.75 0.78 0.74 0.81  CALCIUM 9.0 8.4* 8.7* 8.7* 9.5  MG  --  2.1 2.1 2.3 2.2   Liver Function Tests: Recent Labs  Lab 10/21/21 1501 10/23/21 0124 10/24/21 0155 10/25/21 0136 10/26/21 0714  AST 31 20 21 24 24   ALT 30 24 27 30  34  ALKPHOS 85 66 67 72 78  BILITOT 0.4 0.6 0.6 0.5 0.2*  PROT 7.1 5.8* 6.0* 6.1* 7.4  ALBUMIN 3.2* 2.5* 2.5* 2.5* 3.2*   Recent Labs  Lab 10/21/21 1501  LIPASE 25   No results for input(s): AMMONIA in the last 168 hours. CBC: Recent Labs  Lab 10/21/21 1501 10/23/21 0124 10/24/21 0155 10/25/21 0136 10/26/21 0714  WBC 8.9 8.9 7.8 6.8 8.2  NEUTROABS 7.5 6.6 5.8 4.9 6.5  HGB 13.2 11.7* 11.8* 11.3* 13.4  HCT 40.4 35.5* 36.2 35.3* 41.3  MCV 88.6 87.9  88.3 89.1 88.2  PLT 198 171 194 193 217   Cardiac Enzymes: No results for input(s): CKTOTAL, CKMB, CKMBINDEX, TROPONINI in the last 168 hours. BNP: Invalid input(s): POCBNP CBG: No results for input(s): GLUCAP in the last 168 hours. D-Dimer No results for input(s): DDIMER in the last 72 hours. Hgb A1c No results for input(s): HGBA1C in the last 72 hours. Lipid Profile No results for input(s): CHOL, HDL, LDLCALC, TRIG, CHOLHDL, LDLDIRECT in the last 72 hours. Thyroid function studies No results for input(s): TSH, T4TOTAL,  T3FREE, THYROIDAB in the last 72 hours.  Invalid input(s): FREET3 Anemia work up No results for input(s): VITAMINB12, FOLATE, FERRITIN, TIBC, IRON, RETICCTPCT in the last 72 hours. Urinalysis    Component Value Date/Time   COLORURINE YELLOW 10/21/2021 1701   APPEARANCEUR HAZY (A) 10/21/2021 1701   LABSPEC >1.046 (H) 10/21/2021 1701   PHURINE 6.0 10/21/2021 1701   GLUCOSEU NEGATIVE 10/21/2021 1701   HGBUR NEGATIVE 10/21/2021 1701   BILIRUBINUR NEGATIVE 10/21/2021 1701   KETONESUR NEGATIVE 10/21/2021 1701   PROTEINUR NEGATIVE 10/21/2021 1701   NITRITE NEGATIVE 10/21/2021 1701   LEUKOCYTESUR LARGE (A) 10/21/2021 1701   Sepsis Labs Invalid input(s): PROCALCITONIN,  WBC,  LACTICIDVEN Microbiology Recent Results (from the past 240 hour(s))  Culture, blood (routine x 2)     Status: None   Collection Time: 10/21/21  3:03 PM   Specimen: BLOOD RIGHT ARM  Result Value Ref Range Status   Specimen Description BLOOD RIGHT ARM  Final   Special Requests   Final    BOTTLES DRAWN AEROBIC AND ANAEROBIC Blood Culture adequate volume   Culture   Final    NO GROWTH 5 DAYS Performed at [facility], [place] [address] [place], [place] [address]    Report Status 10/26/2021 FINAL  Final  Culture, blood (routine x 2)     Status: None   Collection Time: 10/21/21  3:05 PM   Specimen: BLOOD LEFT HAND  Result Value Ref Range Status   Specimen Description BLOOD LEFT HAND  Final   Special Requests   Final    BOTTLES DRAWN AEROBIC ONLY Blood Culture results may not be optimal due to an inadequate volume of blood received in culture bottles   Culture   Final    NO GROWTH 5 DAYS Performed at [facility], [place] [address] [place], [place] [address]    Report Status 10/26/2021 FINAL  Final  Resp Panel by RT-PCR (Flu A&B, Covid) Nasopharyngeal Swab     Status: None   Collection Time: 10/22/21  2:47 AM   Specimen: Nasopharyngeal Swab; Nasopharyngeal(NP) swabs in vial transport medium  Result  Value Ref Range Status   SARS Coronavirus 2 by RT PCR NEGATIVE NEGATIVE Final    Comment: (NOTE) SARS-CoV-2 target nucleic acids are NOT DETECTED.  The SARS-CoV-2 RNA is generally detectable in upper respiratory specimens during the acute phase of infection. The lowest concentration of SARS-CoV-2 viral copies this assay can detect is 138 copies/mL. A negative result does not preclude SARS-Cov-2 infection and should not be used as the sole basis for treatment or other patient management decisions. A negative result may occur with  improper specimen collection/handling, submission of specimen other than nasopharyngeal swab, presence of viral mutation(s) within the areas targeted by this assay, and inadequate number of viral copies(<138 copies/mL). A negative result must be combined with clinical observations, patient history, and epidemiological information. The expected result is Negative.  Fact Sheet for Patients:  EntrepreneurPulse.com.au  Fact Sheet for Healthcare Providers:  IncredibleEmployment.be  This test is no t yet approved or cleared by the [place] FDA and  has been authorized for detection and/or diagnosis of SARS-CoV-2 by FDA under an Emergency Use Authorization (EUA). This EUA will remain  in effect (meaning this test can be used) for the duration of the COVID-19 declaration under Section 564(b)(1) of the Act, 21 U.S.C.section 360bbb-3(b)(1), unless the authorization is terminated  or revoked sooner.       Influenza A by PCR NEGATIVE NEGATIVE Final   Influenza B by PCR NEGATIVE NEGATIVE Final    Comment: (NOTE) The Xpert Xpress SARS-CoV-2/FLU/RSV plus assay is intended as an aid in the diagnosis of influenza from Nasopharyngeal swab specimens and should not be used as a sole basis for treatment. Nasal washings and aspirates are unacceptable for Xpert Xpress SARS-CoV-2/FLU/RSV testing.  Fact Sheet for  Patients: EntrepreneurPulse.com.au  Fact Sheet for Healthcare Providers: IncredibleEmployment.be  This test is not yet approved or cleared by the [place] FDA and has been authorized for detection and/or diagnosis of SARS-CoV-2 by FDA under an Emergency Use Authorization (EUA). This EUA will remain in effect (meaning this test can be used) for the duration of the COVID-19 declaration under Section 564(b)(1) of the Act, 21 U.S.C. section 360bbb-3(b)(1), unless the authorization is terminated or revoked.  Performed at [facility], [place] [address] [place], [place] [address]   Urine Culture     Status: Abnormal   Collection Time: 10/22/21  2:47 AM   Specimen: Urine, Clean Catch  Result Value Ref Range Status   Specimen Description URINE, CLEAN CATCH  Final   Special Requests Normal  Final   Culture (A)  Final    <10,000 COLONIES/mL INSIGNIFICANT GROWTH Performed at [facility], [place] [address], [place], [place] [address]    Report Status 10/22/2021 FINAL  Final  Blood Culture (routine x 2)     Status: Abnormal   Collection Time: 10/22/21  2:52 AM   Specimen: BLOOD RIGHT FOREARM  Result Value Ref Range Status   Specimen Description BLOOD RIGHT FOREARM  Final   Special Requests   Final    BOTTLES DRAWN AEROBIC AND ANAEROBIC Blood Culture adequate volume   Culture  Setup Time   Final    GRAM POSITIVE COCCI IN CLUSTERS ANAEROBIC BOTTLE ONLY CRITICAL RESULT CALLED TO, READ BACK BY AND VERIFIED WITH: E. SINCLAIR PHARMD, AT 1120 10/23/21 D. VANHOOK    Culture (A)  Final    STAPHYLOCOCCUS EPIDERMIDIS THE SIGNIFICANCE OF ISOLATING THIS ORGANISM FROM A SINGLE SET OF BLOOD CULTURES WHEN MULTIPLE SETS ARE DRAWN IS UNCERTAIN. PLEASE NOTIFY THE MICROBIOLOGY DEPARTMENT WITHIN ONE WEEK IF SPECIATION AND SENSITIVITIES ARE REQUIRED. Performed at [facility], [place] [address] [place], [place] [address]    Report Status 10/25/2021 FINAL   Final  Blood Culture ID Panel (Reflexed)     Status: Abnormal   Collection Time: 10/22/21  2:52 AM  Result Value Ref Range Status   Enterococcus faecalis NOT DETECTED NOT DETECTED Final   Enterococcus Faecium NOT DETECTED NOT DETECTED Final   Listeria monocytogenes NOT DETECTED NOT DETECTED Final   Staphylococcus species DETECTED (A) NOT DETECTED Final    Comment: CRITICAL RESULT CALLED TO, READ BACK BY AND VERIFIED WITH: E. SINCLAIR PHARMD, AT 1120 10/23/21 D. VANHOOK    Staphylococcus aureus (BCID) NOT DETECTED NOT DETECTED Final   Staphylococcus epidermidis DETECTED (A) NOT DETECTED Final    Comment:  Methicillin (oxacillin) resistant coagulase negative staphylococcus. Possible blood culture contaminant (unless isolated from more than one blood culture draw or clinical case suggests pathogenicity). No antibiotic treatment is indicated for blood  culture contaminants. CRITICAL RESULT CALLED TO, READ BACK BY AND VERIFIED WITH: E. SINCLAIR PHARMD, AT 1120 10/23/21 D. VANHOOK    Staphylococcus lugdunensis NOT DETECTED NOT DETECTED Final   Streptococcus species NOT DETECTED NOT DETECTED Final   Streptococcus agalactiae NOT DETECTED NOT DETECTED Final   Streptococcus pneumoniae NOT DETECTED NOT DETECTED Final   Streptococcus pyogenes NOT DETECTED NOT DETECTED Final   A.calcoaceticus-baumannii NOT DETECTED NOT DETECTED Final   Bacteroides fragilis NOT DETECTED NOT DETECTED Final   Enterobacterales NOT DETECTED NOT DETECTED Final   Enterobacter cloacae complex NOT DETECTED NOT DETECTED Final   Escherichia coli NOT DETECTED NOT DETECTED Final   Klebsiella aerogenes NOT DETECTED NOT DETECTED Final   Klebsiella oxytoca NOT DETECTED NOT DETECTED Final   Klebsiella pneumoniae NOT DETECTED NOT DETECTED Final   Proteus species NOT DETECTED NOT DETECTED Final   Salmonella species NOT DETECTED NOT DETECTED Final   Serratia marcescens NOT DETECTED NOT DETECTED Final   Haemophilus influenzae NOT  DETECTED NOT DETECTED Final   Neisseria meningitidis NOT DETECTED NOT DETECTED Final   Pseudomonas aeruginosa NOT DETECTED NOT DETECTED Final   Stenotrophomonas maltophilia NOT DETECTED NOT DETECTED Final   Candida albicans NOT DETECTED NOT DETECTED Final   Candida auris NOT DETECTED NOT DETECTED Final   Candida glabrata NOT DETECTED NOT DETECTED Final   Candida krusei NOT DETECTED NOT DETECTED Final   Candida parapsilosis NOT DETECTED NOT DETECTED Final   Candida tropicalis NOT DETECTED NOT DETECTED Final   Cryptococcus neoformans/gattii NOT DETECTED NOT DETECTED Final   Methicillin resistance mecA/C DETECTED (A) NOT DETECTED Final    Comment: CRITICAL RESULT CALLED TO, READ BACK BY AND VERIFIED WITH: E. SINCLAIR PHARMD, AT 1120 10/23/21 D. Victoriano Lain Performed at [facility], [address] [place], [place] [address]   Blood Culture (routine x 2)     Status: None (Preliminary result)   Collection Time: 10/22/21  3:00 AM   Specimen: BLOOD  Result Value Ref Range Status   Specimen Description BLOOD RIGHT ANTECUBITAL  Final   Special Requests   Final    BOTTLES DRAWN AEROBIC AND ANAEROBIC Blood Culture adequate volume   Culture   Final    NO GROWTH 4 DAYS Performed at [facility], [place] [address], [place], [place] [address]    Report Status PENDING  Incomplete     Time coordinating discharge: Over 30 minutes  SIGNED:   Phillips Climes, MD  [facility] 10/26/2021, 11:21 AM Pager   If 7PM-7AM, please contact night-coverage [email or web address] Password [email or web address]

## 2021-10-26 NOTE — Discharge Instructions (Signed)
Follow with Primary MD [place], [facility] in 7 days   Get CBC, CMP, checked  by Primary MD next visit.    Activity: As tolerated with Full fall precautions use walker/cane & assistance as needed   Disposition ALF   Diet: Regular diet   On your next visit with your primary care physician please Get Medicines reviewed and adjusted.   Please request your Prim.MD to go over all Hospital Tests and Procedure/Radiological results at the follow up, please get all Hospital records sent to your Prim MD by signing hospital release before you go home.   If you experience worsening of your admission symptoms, develop shortness of breath, life threatening emergency, suicidal or homicidal thoughts you must seek medical attention immediately by calling 911 or calling your MD immediately  if symptoms less severe.  You Must read complete instructions/literature along with all the possible adverse reactions/side effects for all the Medicines you take and that have been prescribed to you. Take any new Medicines after you have completely understood and accpet all the possible adverse reactions/side effects.   Do not drive, operating heavy machinery, perform activities at heights, swimming or participation in water activities or provide baby sitting services if your were admitted for syncope or siezures until you have seen by Primary MD or a Neurologist and advised to do so again.  Do not drive when taking Pain medications.    Do not take more than prescribed Pain, Sleep and Anxiety Medications  Special Instructions: If you have smoked or chewed Tobacco  in the last 2 yrs please stop smoking, stop any regular Alcohol  and or any Recreational drug use.  Wear Seat belts while driving.   Please note  You were cared for by a hospitalist during your hospital stay. If you have any questions about your discharge medications or the care you received while you were in the hospital after you are  discharged, you can call the unit and asked to speak with the hospitalist on call if the hospitalist that took care of you is not available. Once you are discharged, your primary care physician will handle any further medical issues. Please note that NO REFILLS for any discharge medications will be authorized once you are discharged, as it is imperative that you return to your primary care physician (or establish a relationship with a primary care physician if you do not have one) for your aftercare needs so that they can reassess your need for medications and monitor your lab values.

## 2021-10-26 NOTE — Progress Notes (Signed)
Mobility Specialist Progress Note   10/26/21 1255  Mobility  Activity Ambulated in hall  Level of Assistance Contact guard assist, steadying assist  Assistive Device Four wheel walker  Distance Ambulated (ft) 200 ft  Mobility Ambulated with assistance in hallway  Mobility Response Tolerated well  Mobility performed by Mobility specialist  Bed Position Chair  $Mobility charge 1 Mobility   Received pt in bed having no complaints and agreeable to mobility. Asymptomatic throughout ambulation but needing constant VC of proper rollator mechanics and eccentric control when sitting down. Pt returned back to chair w/ call bell by side and family in the room.  Holland Falling Mobility Specialist Phone Number [phone]

## 2021-10-26 NOTE — Progress Notes (Signed)
Called [facility] to give report. Pt acknowledge hard copy DNR form, med rec script for 6 day antibiotic and primary md follow up for lab draw. Provided copy of discharge instruction to pt 2 daughters. PT assisted to bathroom by daughter prior to dc. Pt confused calm and cooperative at discharge. Pt discharged back to nursing home facility via daughters private vehicle.

## 2021-10-27 LAB — CULTURE, BLOOD (ROUTINE X 2)
Culture: NO GROWTH
Special Requests: ADEQUATE

## 2022-07-21 DEATH — deceased
